# Patient Record
Sex: Female | Born: 1975 | Race: White | Hispanic: No | Marital: Married | Smoking: Never smoker
Health system: Southern US, Community
[De-identification: ages and names within clinical notes are randomized; demographics above are authoritative.]

## PROBLEM LIST (undated history)

## (undated) DIAGNOSIS — E079 Disorder of thyroid, unspecified: Secondary | ICD-10-CM

## (undated) DIAGNOSIS — E119 Type 2 diabetes mellitus without complications: Secondary | ICD-10-CM

## (undated) HISTORY — PX: EYE SURGERY: SHX253

---

## 1999-01-20 ENCOUNTER — Encounter: Admission: RE | Admit: 1999-01-20 | Discharge: 1999-04-20 | Payer: Self-pay | Admitting: Internal Medicine

## 2001-12-27 ENCOUNTER — Other Ambulatory Visit: Admission: RE | Admit: 2001-12-27 | Discharge: 2001-12-27 | Payer: Self-pay | Admitting: Obstetrics and Gynecology

## 2002-04-03 ENCOUNTER — Ambulatory Visit (HOSPITAL_COMMUNITY): Admission: RE | Admit: 2002-04-03 | Discharge: 2002-04-03 | Payer: Self-pay | Admitting: Obstetrics and Gynecology

## 2002-04-03 ENCOUNTER — Encounter: Payer: Self-pay | Admitting: Obstetrics and Gynecology

## 2002-04-26 ENCOUNTER — Inpatient Hospital Stay (HOSPITAL_COMMUNITY): Admission: AD | Admit: 2002-04-26 | Discharge: 2002-04-26 | Payer: Self-pay | Admitting: Obstetrics & Gynecology

## 2002-05-05 ENCOUNTER — Inpatient Hospital Stay (HOSPITAL_COMMUNITY): Admission: AD | Admit: 2002-05-05 | Discharge: 2002-05-05 | Payer: Self-pay | Admitting: Obstetrics and Gynecology

## 2002-05-09 ENCOUNTER — Inpatient Hospital Stay (HOSPITAL_COMMUNITY): Admission: AD | Admit: 2002-05-09 | Discharge: 2002-05-09 | Payer: Self-pay | Admitting: Obstetrics & Gynecology

## 2002-05-12 ENCOUNTER — Inpatient Hospital Stay (HOSPITAL_COMMUNITY): Admission: AD | Admit: 2002-05-12 | Discharge: 2002-05-19 | Payer: Self-pay | Admitting: Obstetrics and Gynecology

## 2002-05-12 ENCOUNTER — Encounter: Payer: Self-pay | Admitting: Obstetrics and Gynecology

## 2002-05-15 ENCOUNTER — Encounter: Payer: Self-pay | Admitting: Internal Medicine

## 2002-05-15 ENCOUNTER — Encounter: Payer: Self-pay | Admitting: Obstetrics & Gynecology

## 2002-05-15 ENCOUNTER — Encounter (INDEPENDENT_AMBULATORY_CARE_PROVIDER_SITE_OTHER): Payer: Self-pay

## 2002-05-16 ENCOUNTER — Encounter: Payer: Self-pay | Admitting: Cardiology

## 2002-06-15 ENCOUNTER — Other Ambulatory Visit: Admission: RE | Admit: 2002-06-15 | Discharge: 2002-06-15 | Payer: Self-pay | Admitting: Obstetrics and Gynecology

## 2002-11-07 ENCOUNTER — Ambulatory Visit (HOSPITAL_COMMUNITY): Admission: RE | Admit: 2002-11-07 | Discharge: 2002-11-07 | Payer: Self-pay | Admitting: Internal Medicine

## 2003-05-29 ENCOUNTER — Other Ambulatory Visit: Admission: RE | Admit: 2003-05-29 | Discharge: 2003-05-29 | Payer: Self-pay | Admitting: Obstetrics and Gynecology

## 2003-08-13 ENCOUNTER — Ambulatory Visit (HOSPITAL_COMMUNITY): Admission: RE | Admit: 2003-08-13 | Discharge: 2003-08-13 | Payer: Self-pay | Admitting: Obstetrics and Gynecology

## 2003-10-15 ENCOUNTER — Inpatient Hospital Stay (HOSPITAL_COMMUNITY): Admission: AD | Admit: 2003-10-15 | Discharge: 2003-10-15 | Payer: Self-pay | Admitting: Obstetrics & Gynecology

## 2003-10-29 ENCOUNTER — Inpatient Hospital Stay (HOSPITAL_COMMUNITY): Admission: AD | Admit: 2003-10-29 | Discharge: 2003-10-29 | Payer: Self-pay | Admitting: Obstetrics & Gynecology

## 2003-11-10 ENCOUNTER — Inpatient Hospital Stay (HOSPITAL_COMMUNITY): Admission: AD | Admit: 2003-11-10 | Discharge: 2003-11-10 | Payer: Self-pay | Admitting: Obstetrics & Gynecology

## 2003-11-20 ENCOUNTER — Encounter (INDEPENDENT_AMBULATORY_CARE_PROVIDER_SITE_OTHER): Payer: Self-pay | Admitting: Specialist

## 2003-11-20 ENCOUNTER — Inpatient Hospital Stay (HOSPITAL_COMMUNITY): Admission: AD | Admit: 2003-11-20 | Discharge: 2003-11-24 | Payer: Self-pay | Admitting: Obstetrics and Gynecology

## 2003-12-18 ENCOUNTER — Other Ambulatory Visit: Admission: RE | Admit: 2003-12-18 | Discharge: 2003-12-18 | Payer: Self-pay | Admitting: Obstetrics and Gynecology

## 2003-12-19 ENCOUNTER — Inpatient Hospital Stay (HOSPITAL_COMMUNITY): Admission: AD | Admit: 2003-12-19 | Discharge: 2003-12-19 | Payer: Self-pay | Admitting: Obstetrics and Gynecology

## 2004-09-15 ENCOUNTER — Encounter: Admission: RE | Admit: 2004-09-15 | Discharge: 2004-09-15 | Payer: Self-pay | Admitting: Obstetrics and Gynecology

## 2005-03-09 ENCOUNTER — Ambulatory Visit: Payer: Self-pay | Admitting: Hematology & Oncology

## 2005-06-23 ENCOUNTER — Encounter: Admission: RE | Admit: 2005-06-23 | Discharge: 2005-06-23 | Payer: Self-pay | Admitting: Obstetrics and Gynecology

## 2005-10-21 ENCOUNTER — Emergency Department (HOSPITAL_COMMUNITY): Admission: EM | Admit: 2005-10-21 | Discharge: 2005-10-21 | Payer: Self-pay | Admitting: Emergency Medicine

## 2006-01-04 ENCOUNTER — Ambulatory Visit (HOSPITAL_COMMUNITY): Admission: RE | Admit: 2006-01-04 | Discharge: 2006-01-04 | Payer: Self-pay | Admitting: Neurology

## 2006-01-04 ENCOUNTER — Encounter (INDEPENDENT_AMBULATORY_CARE_PROVIDER_SITE_OTHER): Payer: Self-pay | Admitting: Neurology

## 2008-12-06 ENCOUNTER — Emergency Department (HOSPITAL_BASED_OUTPATIENT_CLINIC_OR_DEPARTMENT_OTHER): Admission: EM | Admit: 2008-12-06 | Discharge: 2008-12-07 | Payer: Self-pay | Admitting: Emergency Medicine

## 2008-12-07 ENCOUNTER — Ambulatory Visit: Payer: Self-pay | Admitting: Diagnostic Radiology

## 2010-07-05 ENCOUNTER — Emergency Department (HOSPITAL_BASED_OUTPATIENT_CLINIC_OR_DEPARTMENT_OTHER)
Admission: EM | Admit: 2010-07-05 | Discharge: 2010-07-05 | Payer: Self-pay | Source: Home / Self Care | Admitting: Emergency Medicine

## 2010-10-06 LAB — URINALYSIS, ROUTINE W REFLEX MICROSCOPIC
Glucose, UA: 500 mg/dL — AB
Ketones, ur: >80 mg/dL — AB
Protein, ur: NEGATIVE mg/dL
Urobilinogen, UA: 1 mg/dL (ref 0.0–1.0)

## 2010-10-06 LAB — BASIC METABOLIC PANEL
BUN: 15 mg/dL (ref 6–23)
CO2: 18 mEq/L — ABNORMAL LOW (ref 19–32)
CO2: 19 mEq/L (ref 19–32)
Calcium: 9.9 mg/dL (ref 8.4–10.5)
Chloride: 101 mEq/L (ref 96–112)
Chloride: 108 mEq/L (ref 96–112)
Creatinine, Ser: 0.6 mg/dL (ref 0.4–1.2)
GFR calc Af Amer: >60 mL/min (ref >60)
GFR calc Af Amer: >60 mL/min (ref >60)
GFR calc non Af Amer: >60 mL/min (ref >60)
Glucose, Bld: 202 mg/dL — ABNORMAL HIGH (ref 70–99)
Potassium: 4.2 mEq/L (ref 3.5–5.1)
Potassium: 4.3 mEq/L (ref 3.5–5.1)
Sodium: 138 mEq/L (ref 135–145)

## 2010-10-06 LAB — CBC
HCT: 41.4 % (ref 36.0–46.0)
Hemoglobin: 14.8 g/dL (ref 12.0–15.0)
MCH: 29.4 pg (ref 26.0–34.0)
MCHC: 35.7 g/dL (ref 30.0–36.0)
MCV: 82.3 fL (ref 78.0–100.0)
Platelets: 288 10*3/uL (ref 150–400)
RBC: 5.03 MIL/uL (ref 3.87–5.11)
RDW: 11.8 % (ref 11.5–15.5)
WBC: 10.3 10*3/uL (ref 4.0–10.5)

## 2010-10-06 LAB — DIFFERENTIAL
Basophils Absolute: 0 10*3/uL (ref 0.0–0.1)
Basophils Relative: 0 % (ref 0–1)
Eosinophils Absolute: 0 10*3/uL (ref 0.0–0.7)
Eosinophils Relative: 0 % (ref 0–5)
Lymphocytes Relative: 16 % (ref 12–46)
Lymphs Abs: 1.7 10*3/uL (ref 0.7–4.0)
Monocytes Absolute: 0.5 10*3/uL (ref 0.1–1.0)
Monocytes Relative: 4 % (ref 3–12)
Neutro Abs: 8.2 10*3/uL — ABNORMAL HIGH (ref 1.7–7.7)
Neutrophils Relative %: 79 % — ABNORMAL HIGH (ref 43–77)

## 2010-10-06 LAB — GLUCOSE, CAPILLARY: Glucose-Capillary: 198 mg/dL — ABNORMAL HIGH (ref 70–99)

## 2010-10-06 LAB — PROTIME-INR
INR: 1.01 (ref 0.00–1.49)
Prothrombin Time: 13.5 seconds (ref 11.6–15.2)

## 2010-10-06 LAB — URINE MICROSCOPIC-ADD ON

## 2010-12-11 NOTE — Consult Note (Signed)
[name], [name]                    ACCOUNT NO.:  [id number]   MEDICAL RECORD NO.:  [id number]          PATIENT TYPE:  EMS   LOCATION:  MAJO                         FACILITY:  [facility]   PHYSICIAN:  [name], M.D.DATE OF BIRTH:  Jul 10, 1976   DATE OF CONSULTATION:  07/25/2006  DATE OF DISCHARGE:                                   CONSULTATION   REQUESTING PHYSICIAN:  [name] emergency room.   REASON FOR EVALUATION:  Right-sided numbness.   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  The patient reports that she was  at home today getting ready to go shopping when she noticed the sudden onset  of a numb sensation involving the right side of her face and her right arm,  especially the third, fourth and fifth digits.  This lasted about 20 minutes  and then mostly resolved, although it has remained present to a smaller and  slightly fluctuating extent.  She noticed a little bit of hesitancy in her  speech but no difficulty with dysarthria and no difficulty with chewing or  swallowing.  She denies any difficulty with walking or any difficulty using  the hand for any purpose.  She denies any associated chest pain, shortness  of breath, palpitations, or nausea or alteration of consciousness.  She does  have a left-sided headache which she describes as not throbbing and not  severe.  She says she has never had any symptoms like this before.  She came  to the emergency room for evaluation and neurologic consultation was  subsequently requested.   PAST MEDICAL HISTORY:  She has a history of juvenile-onset insulin-dependent  diabetes since age 35, presently has an insulin pump.  She admits that her  sugars are not under good control at this time.  She also reports  that she  has been diagnosed with a coagulation problem involving factor II.  This  came about because her daughter had a stroke at age 33 and was found to have  the disorder, and then she was subsequently tested and found to have this.  She states that she saw Dr. Myna Mann as well as a hematologist at [facility],  and both of them said that this did not require treatment but rather  recommended to start taking aspirin around age 35.   FAMILY HISTORY:  Remarkable for coagulopathy as above.  There is no history  of early-onset vascular disease.   SOCIAL HISTORY:  She does not smoke.  She has worked as a Runner, broadcasting/film/video in the  past.   MEDICATIONS:  Insulin pump, Synthroid, Zoloft.  She also took antibiotics  recently.   REVIEW OF SYSTEMS:  She had an ear infection last week and took antibiotics  for that.  Beyond that, a 14-system review of systems is negative except as  outlined in the HPI and in the emergency room records.  PHYSICAL EXAMINATION:  VITAL SIGNS:  Temperature 98.7, blood pressure  113/79, pulse 100, respirations 20.  GENERAL:  This is a healthy-appearing female seen in no distress.  HEENT:  Cranium is normocephalic, atraumatic.  Oropharynx is benign.  NECK:  Supple without carotid or supraclavicular bruits.  CARDIAC:  Regular rate and rhythm without murmurs.  NEUROLOGIC:  Mental status:  She is awake and alert and fully oriented to  time, place and person.  Recent and remote memory are intact.  Attention  span, concentration and fund of knowledge are all appropriate.  Speech is  fluent and not dysarthric.  Mood is euthymic and affect appropriate.  Cranial nerves:  Funduscopic exam reveals mild proliferative changes.  Pupils are equal and briskly reactive.  Extraocular movements full without  nystagmus.  Visual fields full to confrontation.  Hearing is intact to  confrontational speech.  Facial sensation intact to pinprick.  Face, tongue  and palate move normally and  symmetrically.  Shoulder shrug strength is  normal.  Motor testing:  Normal bulk and tone.  Normal strength in all  tested extremity muscles.  Sensation intact to light touch and pinprick in  all extremities.  Coordination:  Rapid movements are performed well, finger-  to-nose and heel-to-shin performed well.  Gait normal.  Reflexes 2+ and  symmetric, toes are downgoing bilaterally.   LABORATORY REVIEW:  MRI of the brain with MRA of the intracranial  circulation performed this evening at [facility] is normal.  Her  labs are unremarkable except for an elevated glucose of 199.   IMPRESSION:  Transient right face and arm numbness.  Given her history of  diabetes and this coagulation problem, this likely represents a small-vessel  transient ischemic attack.   RECOMMENDATIONS:  Will start aspirin daily.  She needs to follow up closely  with her PCP and also with her hematologist.  She is to follow up in our  office in about four weeks.  I think she should start aspirin.  She does not  require admission for further testing at this time, although I may choose to  do further blood testing and possibly other Doppler studies in the future  depending on how she does and how her risk factor control is in the near  future.   Thank you for the consultation.      [name], M.D.  Electronically Signed     MLR/MEDQ  D:  10/21/2005  T:  10/23/2005  Job:  [id number]   cc:   Marissa Pounds, MD  Fax: [phone]   [name], M.D.  Fax: [phone]

## 2010-12-11 NOTE — Op Note (Signed)
[name], [name]                              ACCOUNT NO.:  [id number]   MEDICAL RECORD NO.:  [id number]                   PATIENT TYPE:  INP   LOCATION:  9302                                 [facility]ACILITY:  [facility]   PHYSICIAN:  Maxie Better, M.D.            DATE OF BIRTH:  1976-03-28   DATE OF PROCEDURE:  11/20/2003  DATE OF DISCHARGE:                                 OPERATIVE REPORT   PREOPERATIVE DIAGNOSES:  1. Preterm labor.  2. Intrauterine gestation at 33-1/7 weeks.  3. Previous cesarean section.  4. Desires sterilization.  5. Class C diabetic.   PROCEDURES:  1. Repeat low transverse cesarean section.  2. Modified Pomeroy tubal ligation.   POSTOPERATIVE DIAGNOSES:  1. Preterm labor.  2. Previous cesarean section.  3. Intrauterine gestation at 33-1/7 weeks.  4. Class C diabetes.  5. Desires sterilization.  6. Fetal macrosomia.   ANESTHESIA:  Spinal.   SURGEON:  Maxie Better, M.D.   ASSISTANT:  Richardean Sale, M.D.   INDICATIONS:  This is a 35 year old gravida 2, para 0-1-0-1, married white  female at 33-1/7 weeks' gestation, with known class C diabetes on insulin  pump, who presented in preterm labor.  The patient's history is notable for  a preterm birth at 31+ weeks via a low transverse cesarean section.  The  patient does desire a repeat cesarean section as well as a sterilization.  The patient's prenatal course has been complicated by premature contractions  with the cervix remaining closed until the exam noted in the office today,  November 20, 2003, when the patient presented with very painful contractions  and passage of a mucus plug.  She has been followed with fetal fibronectin  studies, cervical length by ultrasound, and has been on alpha-hydroxy  progesterone on a weekly basis in order to decrease her chance of a repeat  preterm delivery.  The patient's fetal fibronectin, however, was positive  today.  Her history is most significant for pulmonary  edema secondary to  magnesium sulfate tocolytic and given that prior history, a decision was  made not to tocolyze this patient, who is now 1 cm, 60-70%, -2 to -3, vertex  presentation.  The patient and her husband conferred with the plan, the NICU  was informed.  The risks of the procedure had been explained, consent was  signed, the patient was transferred to the operating room.   PROCEDURE:  Under adequate spinal anesthesia, the patient was placed in a  supine position with a left lateral tilt.  She was sterilely prepped and  draped in the usual fashion.  Indwelling Foley catheter was placed.  The  incision was injected with 0.25% Marcaine.  Pfannenstiel skin incision was  then made through the previous scar and carried down to the rectus fascia.  The rectus fascia was incised in the midline, extended bilaterally.  The  rectus fascia was dissected off the rectus  muscle inferiorly and in doing  so, the parietal peritoneum was entered.  The scarring from the rectus  fascia to the rectus muscle superiorly was also noted to be dense and  careful dissection was then made with entry into the parietal peritoneum  anteriorly as well.  Using that window in the parietal peritoneum, the  incision was extended in order to facilitate the delivery.  There was a  small amount of bladder adhesions noted.  The vesicouterine peritoneum was  then developed.  The bladder was then sharply dissected off the lower  uterine segment.  A curvilinear low transverse incision was then made and  extended bilaterally.  Artificial rupture of membranes was performed.  Copious amount of clear amniotic fluid was noted.  There was a gush of blood  noted on the left side, unclear as to whether or not this is evidence of an  abruption.  Subsequent delivery of a live female from the right occiput  transverse position was accomplished.  The cord was around the body.  The  baby was bulb-suctioned on the abdomen.  The cord was  clamped, cut.  The  baby was transferred to the awaiting pediatricians, who assigned Apgars of 8  and 9 at one and five minutes.  Cord pH was obtained.  The placenta was  removed and sent to pathology as well.  The uterine cavity was palpated,  inspected, and no intracavitary lesions noted.  The uterine incision had a  small extension on the left.  The uterine incision was closed with 0  Monocryl running locked stitch.  On the left side there was some bleeding,  and figure-of-eight sutures x2 were then utilized to accomplish hemostasis.  Small bleeding on the edges of the lower uterine segment was then  cauterized.  Attention was then turned to the tubes and ovaries.  Both  ovaries were noted to be bilaterally normal, as well as the tubes.  The left  fallopian tube was identified.  The midportion was grasped with a Babcock.  The underlying mesosalpinx was opened.  Chromic 0 sutures were placed  proximally x2, as well as distally x2, and the intervening segment of  fallopian tube was removed and the stump cauterized.  This procedure was  performed on the contralateral side, removing the midportion of the right  fallopian tube.  The abdomen was irrigated, suctioned of debris.  Reinspection of the uterine incision showed good hemostasis.  A decision was  then made to close the patient.  The parietal peritoneum was now closed.  The rectus fascia was closed with 0 Vicryl x2 after the underlying area was  inspected and bleeders cauterized.  The subcutaneous area was irrigated,  suctioned of debris, small bleeders cauterized, and the skin approximated  with Ethicon staples.  Specimen was placenta and portion of the right and  left fallopian tube, all sent to pathology.  Estimated blood loss was 600  mL.  Intraoperative fluid was about 1700 mL crystalloid.  Urine output was  about 70 mL of clear urine.  The patient had voided prior to entering the operating room.  Sponge and instrument counts x2  were correct.  Complication  was none.  The patient tolerated the procedure well and was transferred to  the recovery room in stable condition.  Maxie Better, M.D.    /MEDQ  D:  11/20/2003  T:  11/21/2003  Job:  [id number]

## 2010-12-11 NOTE — Discharge Summary (Signed)
[name], [name]                              ACCOUNT NO.:  [id number]   MEDICAL RECORD NO.:  [id number]                   PATIENT TYPE:  INP   LOCATION:  9146                                 FACILITY:  [facility]   PHYSICIAN:  Genia Del, M.D.             DATE OF BIRTH:  [birth year]   DATE OF ADMISSION:  05/12/2002  DATE OF DISCHARGE:  05/19/2002                                 DISCHARGE SUMMARY   ADMISSION DIAGNOSES:  1. Gestation at 30 weeks and six days with preterm labor.  2. Class C diabetes mellitus.   DISCHARGE DIAGNOSES:  1. Gestation at 30 weeks and six days with preterm labor.  2. Class C diabetes mellitus.  3. Polyhydramnios.  4. Pulmonary edema.  5. Nonreassuring fetal heart monitoring.  6. Baby boy born by urgent cesarean section on May 15, 2002.   HOSPITAL COURSE:  The patient is a 35 year old G1 with class C diabetes  mellitus who presented with preterm labor on May 12, 2002.  She was  given magnesium sulfate to control preterm labor and developed pulmonary  edema.  Magnesium sulfate had been stopped on October 20 and she developed  worsening pulmonary edema for which she was evaluated around 5:50 a.m. on  May 15, 2002.  At that time she was on Procardia and Motrin.  A severe  case of polyhydramnios was also worsening.  A decompression amniocentesis  was therefore performed, removing about 450 cc of amniotic fluid.  The fluid  was clear; it was sent for L/S and PG and a culture was done on it.  The  chest x-ray confirmed probably pulmonary edema.  Her O2 saturations were 85-  88% with O2 mask rebreather at 15%.  The patient was contracting every two  to three minutes lasting 60 seconds and those were painful.  Her cervix was  1+, 80%, vertex, 0.  Given the fetal heart rate with a baseline at 200 with  repetitive decelerations, and with the continued uterine contractions for  which magnesium sulfate could not be restarted because of the pulmonary  edema, the decision was taken to proceed with urgent primary C section.  The  patient had a baby boy at 13.  Apgars were 6 and 9.  The neonatal team was  present.  The enterotomy was closed in two planes.  The estimated blood loss  was 800 cc.  The patient received Flagyl during the surgery and no  complications occurred.  The postoperative evolution was unremarkable.  The  pulmonary edema resolved with an excellent diuresis and the hemoglobin  postoperatively on October 22 was 10.5 with a hematocrit at 30.7.   DISPOSITION:  The patient was discharged on postoperative day #4 in a stable  status.  Postoperative postpartum advices were given, and the patient will  follow up at [facility] office within four weeks.  She will follow up  with  Dr. Timothy Lasso for diabetes mellitus.                                               Genia Del, M.D.    ML/MEDQ  D:  07/16/2002  T:  07/17/2002  Job:  [id number]

## 2010-12-11 NOTE — Consult Note (Signed)
[name], [name]                              ACCOUNT NO.:  [id number]   MEDICAL RECORD NO.:  [id number]                   PATIENT TYPE:  MAT   LOCATION:  MATC                                 [facility]ACILITY:  [facility]   PHYSICIAN:  [name], M.D.             DATE OF BIRTH:  01/07/1976   DATE OF CONSULTATION:  DATE OF DISCHARGE:  05/05/2002                                   CONSULTATION   CHIEF COMPLAINT:  Contractions.   HISTORY OF PRESENT ILLNESS:  The patient is a 35 year old white female G1,  P0 at 89 and 6/7 weeks who presents with increased frequency of  contractions.  She was given Motrin to take q.6h. in the last 24 hours with  marked improvement in her contractions one day ago, now with increased  frequency this evening after taking ibuprofen.  She presented to [facility] with increased frequency of contractions and a reactive fetal heart  rate tracing.  Given Procardia 10 mg on one occasion with good spacing out  of her contractions.  Noted after approximately an hour contraction  frequency increased.  She is given now terbutaline subcutaneous x1 with good  response.   PAST OBSTETRIC HISTORY:  Noncontributory.   ALLERGIES:  She has no known drug allergies.   MEDICATIONS:  Prenatal vitamins and insulin pump.   PAST MEDICAL HISTORY:  She has a history of insulin-dependent diabetes since  age 34.  She has no history of any medical hospitalizations outside of  diabetes.  History of wisdom tooth removal in 1997.   FAMILY HISTORY:  There is a family history of thyroid dysfunction,  cardiovascular disease, emphysema, and diabetes.   PRENATAL LABORATORY DATA:  Blood type A-.  RhoGAM given.  Rh antibody  negative.  Rubella immune.  HIV declined.  Prenatal course otherwise  uncomplicated, reportedly normal fetal echocardiogram at 20 weeks.  Normal  targeted ultrasound as well.   PHYSICAL EXAMINATION:  GENERAL:  She is an anxious appearing white female in  no apparent  distress.  HEENT:  Normal.  LUNGS:  Clear.  HEART:  Regular rate and rhythm.  ABDOMEN:  Soft, gravid, and nontender.  PELVIC:  Cervix is closed, flared, firm, 3 cm long, presenting part is out  of the pelvis.  EXTREMITIES:  No cords.  NEUROLOGIC:  Nonfocal.  SKIN:  Intact.   LABORATORIES:  NST is reactive.  Fetal heart rate in the 140-160 beat per  minute range with accelerations.  No evidence of decelerations.  No  contractions noted over the last 30 minutes.   IMPRESSION:  1. A 29 and 6/7 week intrauterine pregnancy.  2. Preterm contractions.  No evidence of cervical change.  Negative fetal     fibronectin three days ago.  3. Insulin-dependent diabetes with borderline compliance.  Reported history     of large for gestational age with polyhydramnios.   PLAN:  Follow up in the office in three days.  Discharged home on Procardia  10 mg q.4-6h.  Preterm labor warnings given.  Glucose compliance discussed.                                               [name], M.D.    RJT/MEDQ  D:  05/05/2002  T:  05/07/2002  Job:  [id number]   cc:   [facility]

## 2010-12-11 NOTE — Op Note (Signed)
NAME:  MARIAN, MENEELY                              ACCOUNT NO.:  [id number]   MEDICAL RECORD NO.:  [id number]                   PATIENT TYPE:  INP   LOCATION:  9179                                 [facility]ACILITY[facility]   PHYSICIAN:  Genia Del, M.D.             DATE OF BIRTH:  1976/02/15   DATE OF PROCEDURE:  05/15/2002  DATE OF DISCHARGE:                                 OPERATIVE REPORT   PREOPERATIVE DIAGNOSES:  1. A 31 week and 3 day gestation.  2. Insulin-dependent diabetes mellitus.  3. Preterm labor.  4. Polyhydramnios.  5. Nonreassuring fetal heart rate monitoring.  6. Pulmonary edema with O2 saturations below 90.   POSTOPERATIVE DIAGNOSES:  1. A 31 week and 3 day gestation.  2. Insulin-dependent diabetes mellitus.  3. Preterm labor.  4. Polhydramnios.  5. Nonreassuring fetal heart rate monitoring.  6. Pulmonary edema with O2 saturations below 90.   INTERVENTION:  Urgent primary low transverse cesarean section.   ANESTHESIOLOGIST:  Quillian Quince, M.D.   SURGEON:  Genia Del, M.D.   ASSISTANT:  Lenoard Aden, M.D.   PROCEDURE:  Under spinal anesthesia the patient is in 15 degree left  decubitus.  She is prepped with Hibiclens on the abdominal, suprapubic, and  vulvar area.  The bladder catheter is already in place and the patient is  draped as usual.  A Pfannenstiel incision is made with a scalpel.  The  aponeurosis is opened transversely with the Mayo scissors.  We separate the  recti muscles from the aponeurosis on the midline superiorly and inferiorly.  The parietoperitoneum is opened bluntly.  We then put the bladder retractor  in place.  The visceroperitoneum is opened transversely over the lower  uterine segment with the Metzenbaum scissors.  The bladder is reclined  downward and the bladder retractor is repositioned.  We open the lower  uterine segment transversely with the scalpel and prolong on each side with  the scissors.  The amniotic fluid  is clear and abundant.  Presentation is  cephalic.  A loose nuchal cord is present and goes down to the shoulder at  the time of delivery.  The baby is suctioned after delivery of the head.  We  clamp the cord and cut it.  The baby is given to the neonatal team.  Apgars  are 6 and 9.  Cord pH is done and the blood cord is taken.  The placenta is  delivered spontaneously.  It is complete with three vessels.  It is sent to  pathology.  The uterus is revised.  It contracts well with Pitocin in the IV  fluids.  A dose of Flagyl 500 mg IV is given.  The hysterotomy is closed in  a full locked running suture with Vicryl 0.  A second layer is done with  Vicryl 0 in a mattress fashion.  Two X stitches are  added in the midline and  on the left lateral side of the hysterotomy to complete hemostasis with a  Vicryl 0.  Hemostasis is adequate on the hysterotomy.  We then irrigate and  suction the abdominopelvic cavity.  We complete hemostasis with  electrocautery at the level of the bladder flap and on the recti muscles and  aponeurosis.  We close the aponeurosis with two half running sutures of  Vicryl 0.  The count of instruments and sponges is complete x2.  We complete  hemostasis on the adipose tissue with the electrocautery.  Infiltration of  Marcaine 0.25 plain a total of 10 cc is done subcutaneously.  We then  approximate the skin with staples.  A dry dressing is applied.  The  estimated blood loss is 800 cc.  No complications occurred and the patient  was brought to recovery room in good status.  Note a dose of Lasix 20 mg IV  was given in the operating room at the end of the case.  O2 saturations  remained above 90% during the cesarean section.                                               Genia Del, M.D.    ML/MEDQ  D:  05/15/2002  T:  05/15/2002  Job:  [id number]

## 2010-12-11 NOTE — Discharge Summary (Signed)
[name], [name]                              ACCOUNT NO.:  [id number]   MEDICAL RECORD NO.:  [id number]                   PATIENT TYPE:  INP   LOCATION:  9302                                 FACILITY:  [facility]   PHYSICIAN:  Maxie Better, M.D.            DATE OF BIRTH:  1976/06/18   DATE OF ADMISSION:  11/20/2003  DATE OF DISCHARGE:  11/24/2003                                 DISCHARGE SUMMARY   ADMISSION DIAGNOSES:  1. Preterm labor.  2. Intrauterine gestation at 66 and 1/7ths weeks.  3. Class C diabetes.  4. Rh negative.  5. Previous cesarean section.  6. Desires permanent sterilization.  7. Hypothyroidism.   PROCEDURE:  1. Repeat cesarean section.  2. Modified Pomeroy tubal ligation.   POSTOPERATIVE DIAGNOSES:  1. Intrauterine gestation at 54 and 1/7ths weeks, delivered.  2. Preterm labor.  3. Previous cesarean section.  4. Class C diabetes.  5. Hypothyroidism.  6. Desires sterilization.  7. Polyhydramnios.   ANESTHESIA:  Spinal.   SURGEON:  Maxie Better, M.D.   ASSISTANT:  Richardean Sale, M.D.   HISTORY OF PRESENT ILLNESS:  This is a 35 year old, gravid 2, para 0-1-0-1,  married white female with class C diabetes, who is now at 60 and 1/7ths  weeks with a previous cesarean section, admitted in preterm labor.  The  patient's renal course has been complicated by polyhydramnios.  She is known  to have hypothyroidism.  Her class C diabetes has been managed by Dr. Timothy Lasso  using an insulin pump.  The patient is Rh negative, and received RhoGAM.  The patient has been getting weekly alpha hydroxyprogesterone due to her  prior history of preterm delivery at 31 weeks.  That pregnancy had been  complicated by pulmonary edema secondary to magnesium tocolysis.  The  patient presented with complaints of contractions every 2 minutes.  Her  cervix exam up until Tuesday, November 19, 2003, had been closed.  A fetal  fibronectin was done every 2 weeks.  The last fetal  fibronectin on November 20, 2003 was positive.  The patient's cervical exam on presentation was 1, 70%,  -2.  Vertex well applied to the cervix.  A group B strep culture had been  negative at 28 weeks.  The patient had not been given betamethasone due to  her class C diabetes.  Given her poor response to tocolytics in the past,  the decision was made to forego attempt at magnesium sulfate again.  The  patient and husband concurred with the plan.  She also desired a repeat  cesarean section, as well as permanent sterilization.  Consent was obtained  with the appropriate planned procedures.  The patient was transferred to the  operating room.   HOSPITAL COURSE:  The patient was taken to the operating room where she  underwent spinal anesthesia.  She underwent a repeat cesarean section, as  well as a  modified Pomeroy tubal ligation.  The procedure resulted in the  delivery of a live female, Apgars of 8/9, cord pH of 7.25.  The baby was  transferred to the NICU due to prematurity.  The weight of the baby was 6  pounds, 8 ounces.  The patient had normal tubes and ovaries bilaterally.  Her postoperative course was notable for ongoing management of her class C  diabetes by Dr. Timothy Lasso using a Glucomander.  She was continued on her  Synthroid medications.  She received RhoGAM due to the baby's blood type  being A positive.  The patient was subsequently converted back to an insulin  pump.  She was placed on a diabetes management diet.  A CBC on postoperative  day #1 showed a hemoglobin of 12.8, white count of 9.5, hematocrit 37.9,  platelet count 169,000.  The pathology revealed a third trimester placenta,  and complete transection of both fallopian tubes.  The patient remained  afebrile throughout her hospitalization.  By postoperative day #4, she was  tolerating her diet, passing flatus, no evidence of infection, and was  deemed ready to be discharged home.   DISPOSITION:  Home.   CONDITION:   Stable.   DISCHARGE MEDICATIONS:  1. Synthroid.  2. NovoLog insulin pump.  3. Prenatal vitamins.  4. Zantac p.r.n.  5. Motrin 800 mg one p.o. q.6h. p.r.n. pain.   FOLLOW UP APPOINTMENT:  1. At [facility] in 4-6 weeks.  2. Other follow up with Dr. Timothy Lasso regarding the diabetes and hypothyroidism.   DISCHARGE INSTRUCTIONS:  Per postpartum booklet given.                                               Maxie Better, M.D.    [place]/MEDQ  D:  12/07/2003  T:  12/07/2003  Job:  [id number]

## 2010-12-11 NOTE — Consult Note (Signed)
[name], [name]                              ACCOUNT NO.:  [id number]   MEDICAL RECORD NO.:  [id number]                   PATIENT TYPE:  INP   LOCATION:  9153                                 FACILITY:  [facility]   PHYSICIAN:  Gaspar Garbe, M.D.            DATE OF BIRTH:  10/09/1975   DATE OF CONSULTATION:  05/13/2002  DATE OF DISCHARGE:                                   CONSULTATION   REASON FOR CONSULTATION:  Preterm labor with diabetes mellitus type 1.   HISTORY OF PRESENT ILLNESS:  The patient is a 35 year old, white female who  is [redacted] weeks pregnant with her first child.  She has had difficulties with  blood sugar control in the past and has been diabetic since age 42.  She is  currently and insulin pump using Humalog through a MiniMed 508 pump.  She  has been followed per routine by Dr. Creola Corn with [facility] for her diabetes.  The patient states that she has had  historically poor control, but very good control imperatively during this  time of her pregnancy.   She came into [facility] after having cramps and was evaluated by Dr.  Cherly Hensen and seemed to be in preterm labor.  At that point, she was given  magnesium and betamethasone, a steroid which would affect and elevate her  blood sugars.  Dr. Cherly Hensen and I spoke on the phone after I received some  information regarding the patient's status and her insulin settings on her  pump in her usual bolus regimen and a sliding scale was written out for the  nursing staff to use through the pump in lieu of IV insulin that has been  initially suggested by Dr. Cherly Hensen.   The patient had reasonably good control overnight with a blood glucose at 1  a.m. at 131, 3 a.m. 147, but then gradually started to escalate throughout  the rest of the day to 247, 227, 158, 285 and most recently 221 after 10  units of bolus.  She is still having contractions consistent with preterm  labor and had some nausea with the  contractions, but this is separate from  her elevated sugar issue at this point.  Otherwise, she feels reasonably  well.  She indicates that she has had some difficulty with sites as her  pregnancy has gone on and has to change approximately every 1-1/2 days.  In  addition, she is due to have her pump set up and her reservoir refilled  today and the they have brought supplies from home to be able to do this.   PAST MEDICAL HISTORY:  Diabetes mellitus type 1 since age 27.   PAST SURGICAL HISTORY:  Wisdom teeth extraction.   ALLERGIES:  No known drug allergies.   MEDICATIONS:  Humalog per pump for a hydrate ratio of 1 to 12.  Her usual  formula is CBG - 100/10 and increase from a factor of 25 which was her  initial prepregnancy factor.   SOCIAL HISTORY:  The patient is married.  This is her first pregnancy.  She  is an [employer].  No smoking and no drinking.   FAMILY HISTORY:  She has some diabetes in a grandfather and grandmother, one  believed to be type 2 and one believed to be type 1.   REVIEW OF SYMPTOMS:  The patient is having some mild nausea and abdominal  cramping, consistent with labor, otherwise within normal limits.   PHYSICAL EXAMINATION:  GENERAL:  Her blood sugars are stated as above with  the most recent being 221 at 11 a.m.  She is in no acute distress, but  uncomfortable.  LUNGS:  Clear to auscultation bilaterally.  HEART:  Regular rate and rhythm without murmur.  ABDOMEN:  Pregnant with no focal changes or tenderness.  She has some  bruising at old sites, but otherwise look okay.  EXTREMITIES:  No pitting edema present.   LABORATORY DATA AND X-RAY FINDINGS:  The patient most recently had a  magnesium level of 5.  RPR negative on admission.  White count 8.9,  hemoglobin 11.9, platelets 214.  Normal electrolytes with a blood glucose of  147 at 2023 hours on the day of admission, May 12, 2002.  She has a  creatinine of 0.6.  Normal uric acid of  4.2.   ASSESSMENT/PLAN:  The patient is in preterm labor.  This is being managed by  Dr. Cherly Hensen.  The patient has been given steroids to try and help with this  which will, in fact, increase her blood sugars.  I was not convinced of her  factor of 10.  Over the phone, the patient and Dr. Cherly Hensen had initially  said that her factor was 2 which would give her approximately 50 units for  every 100 that she is above 100, which seemed to be terribly excessive given  her basals which are approximately 30 units a day.  A sliding scale was  initially written and this will be revised with her Humalog being three  units for 150-175 reading, six units for 176-200 reading, eight units for a  201-250 reading, 12 units for a 251-300 reading and 14 units to 301+.  The  patient is being checked every two hours and understandably will have some  overlap given the half life of Humalog being four hours.  This is slightly  less than her normal 1/10 ratio.  She will be checked every hour with blood  glucoses until she once again drops below 200 and then will resume q.2h.  checks while here in the hospital.  We will watch her over the next 24 hours  from 8 a.m. today to 8 a.m. on October 20, to try and determine  approximately how much extra sliding scale she is requiring and give her  approximately half of this back as the basal rate for short-term as she has  the betamethasone on board and that she is not using as much bolus.  Compared to what her basals are, her bolus is significantly higher  presently.  We will look for any signs of ketosis.  I do not believe her  nausea is related to this, however, and encourage her if she is eating to  continue with the 1/12 bolus factor.  In addition, will write orders  specifically allowing her to suspend her pump to change her  pump set and  refill her reservoir with Humalog as she is due for that today.                                               Gaspar Garbe, M.D.    RWT/MEDQ  D:  05/13/2002  T:  05/14/2002  Job:  [id number]   cc:   Maxie Better, MD  [address]  [address]  [place]  [place] [address]  Fax: [phone]   Gwen Pounds, M.D.

## 2011-11-30 IMAGING — CT CT ANGIO CHEST
2 of 6 series · 19 of 36 positions shown · IV contrast (APPLIED)
Comparison: None.

CLINICAL DATA: Chest pain.  Diabetes.

CT ANGIOGRAPHY CHEST WITH CONTRAST
TECHNIQUE: Multidetector CT imaging of the chest was performed
using the standard protocol during bolus administration of
intravenous contrast.  Multiplanar CT image reconstructions
including MIPs were obtained to evaluate the vascular anatomy.
Contrast:  100 ml Omnipaque three-phase the.

[Series 5: pe 1.0 b25f · axial · 0.59mm/px · z∈[-131,+105]mm · 18 of 264 slices shown]
[im 14/264  lung]
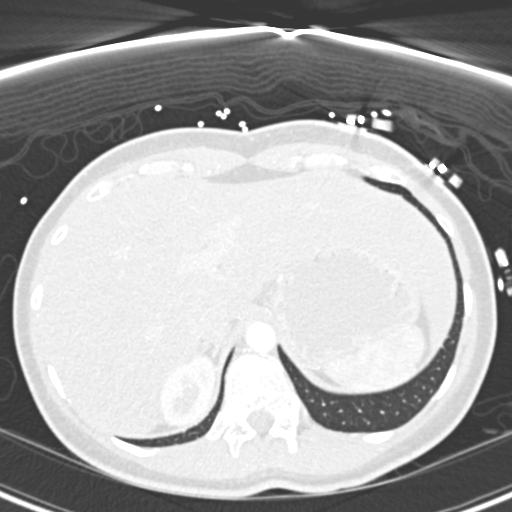
[im 27/264  mediastinal]
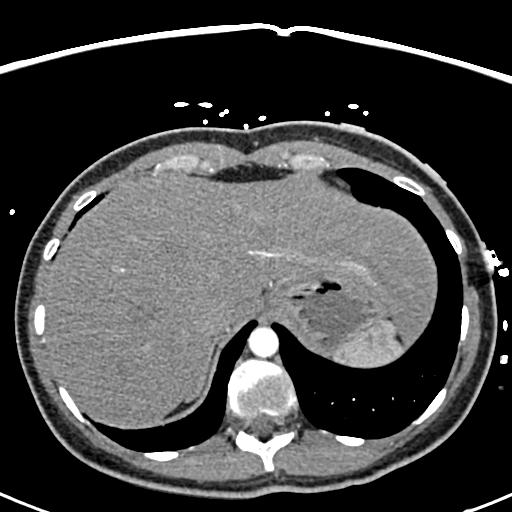
[im 40/264  lung]
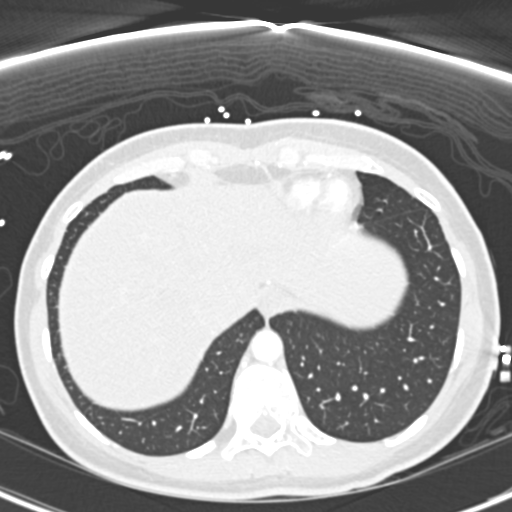
[im 53/264  mediastinal]
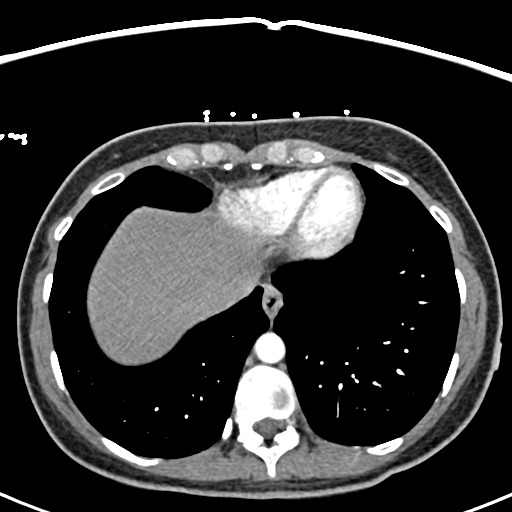
[im 66/264  lung]
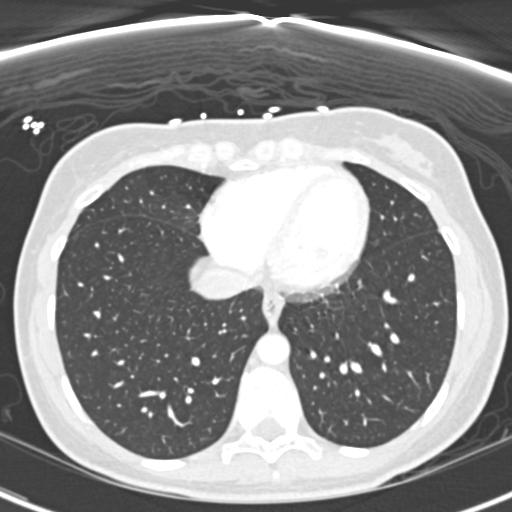
[im 79/264  mediastinal]
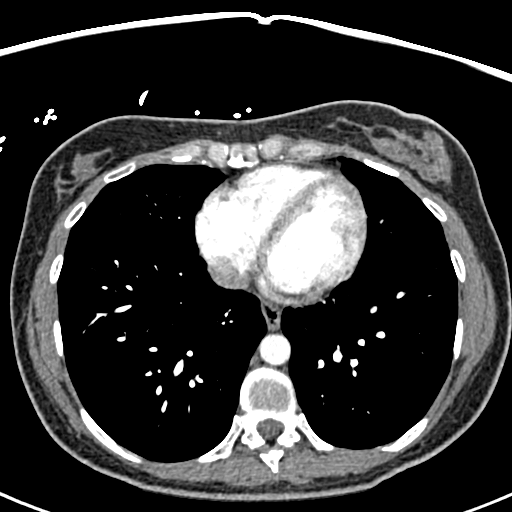
[im 93/264  lung]
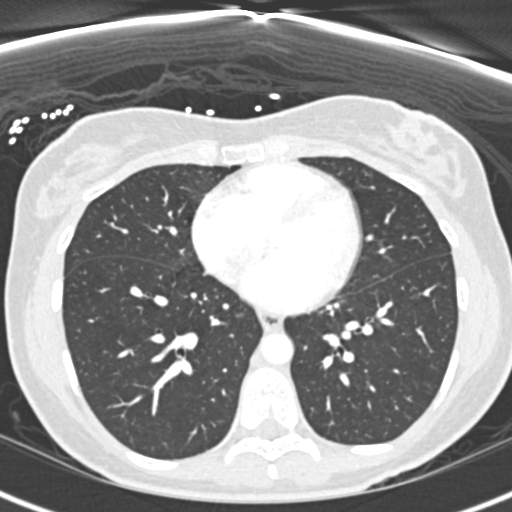
[im 106/264  mediastinal]
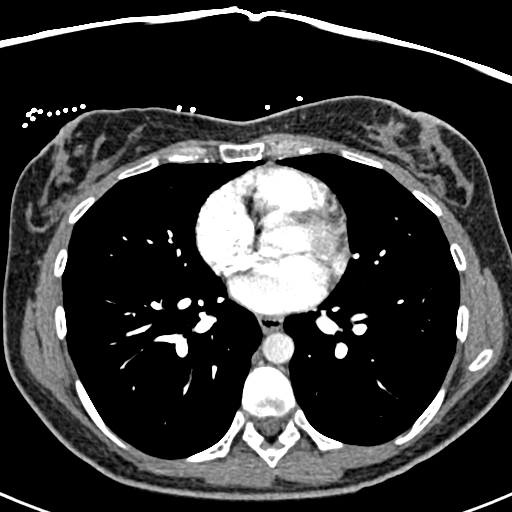
[im 119/264  lung]
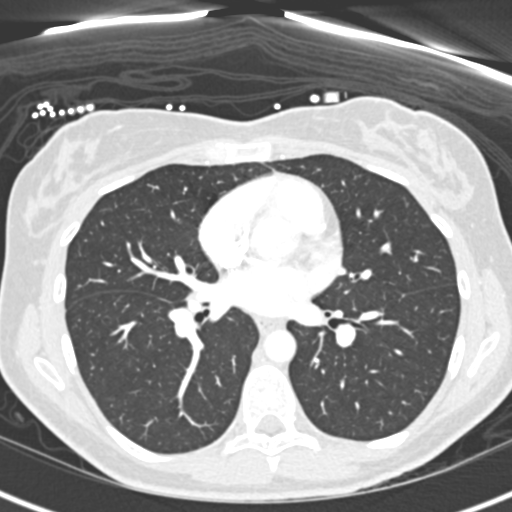
[im 145/264  mediastinal]
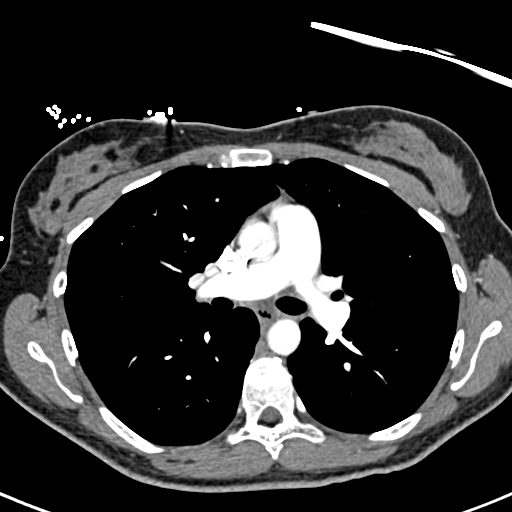
[im 158/264  lung]
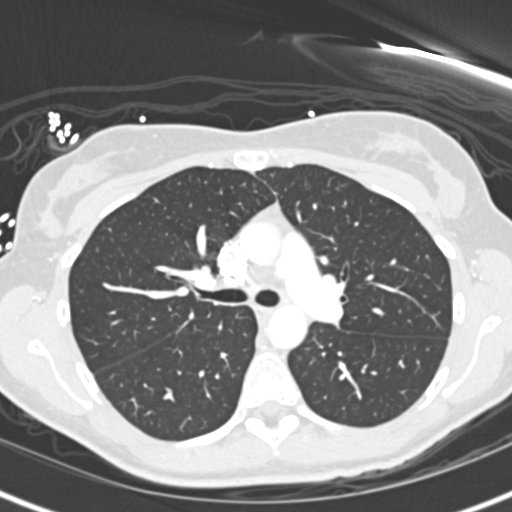
[im 171/264  mediastinal]
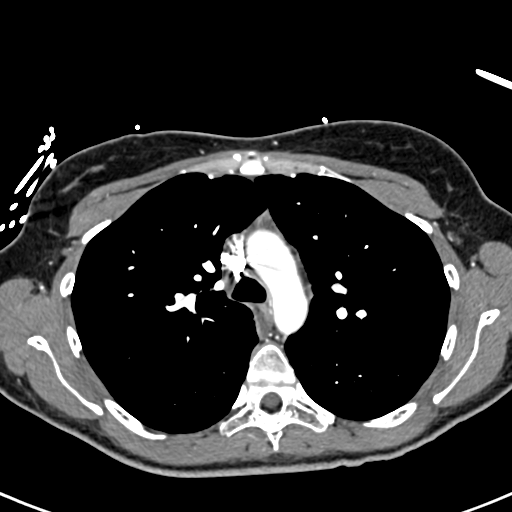
[im 185/264  lung]
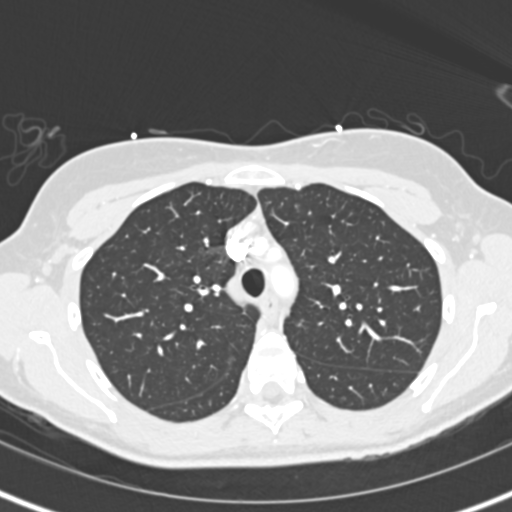
[im 198/264  mediastinal]
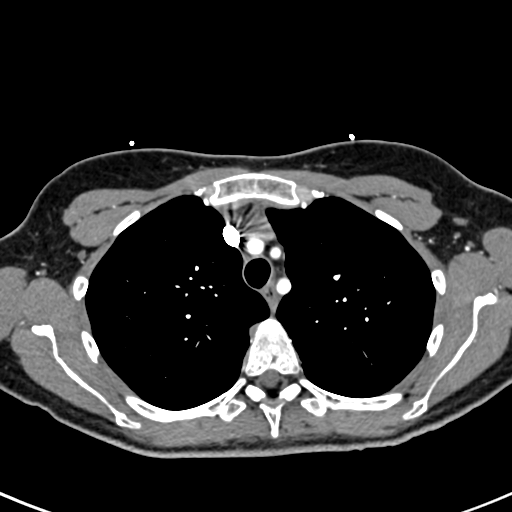
[im 211/264  lung]
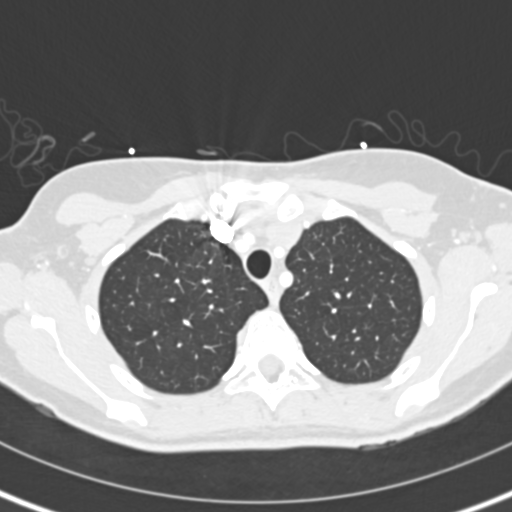
[im 224/264  mediastinal]
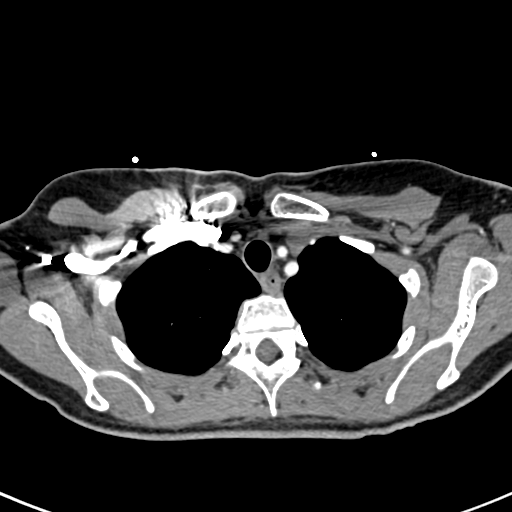
[im 237/264  lung]
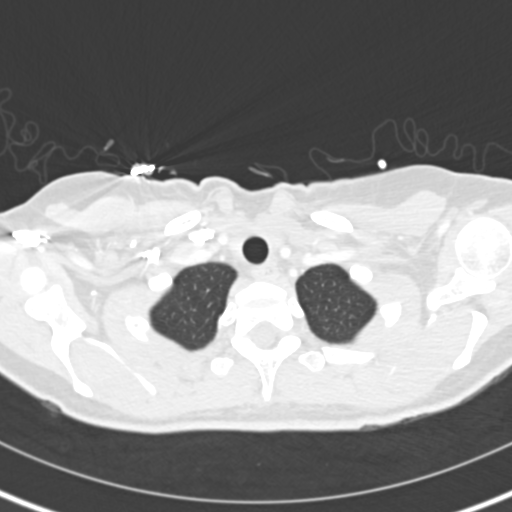
[im 250/264  mediastinal]
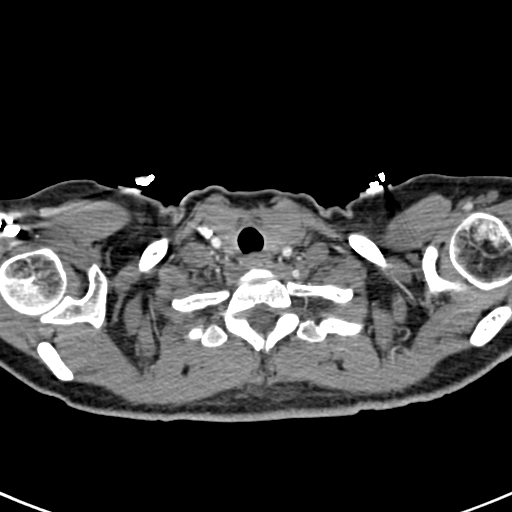

[Series 8: pe 2.0 coronal · coronal · 0.58mm/px · 1 of 105 slices shown]
[im 53/105  mediastinal]
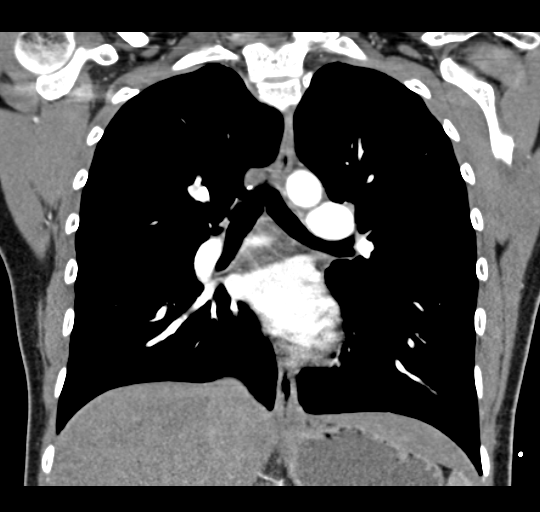

[19 of 36 positions shown; findings below may reference images not displayed]

FINDINGS: Technically adequate study without pulmonary embolus.
No acute aortic abnormality.  The heart and aorta appear normal.
No axillary, mediastinal, or hilar adenopathy.  No pericardial or
pleural effusion.  Incidental imaging the upper abdomen is normal.
The lungs are clear.  The bones are normal.

Review of the MIP images confirms the above findings.
IMPRESSION: Normal CTA chest.

## 2014-07-11 ENCOUNTER — Encounter (HOSPITAL_BASED_OUTPATIENT_CLINIC_OR_DEPARTMENT_OTHER): Payer: Self-pay | Admitting: *Deleted

## 2014-07-11 ENCOUNTER — Inpatient Hospital Stay (HOSPITAL_BASED_OUTPATIENT_CLINIC_OR_DEPARTMENT_OTHER)
Admission: EM | Admit: 2014-07-11 | Discharge: 2014-07-13 | DRG: 638 | Disposition: A | Discharge status: Home / Self Care | Payer: Managed Care, Other (non HMO) | Attending: Internal Medicine | Admitting: Internal Medicine

## 2014-07-11 ENCOUNTER — Emergency Department (HOSPITAL_BASED_OUTPATIENT_CLINIC_OR_DEPARTMENT_OTHER): Payer: Managed Care, Other (non HMO)

## 2014-07-11 DIAGNOSIS — E1110 Type 2 diabetes mellitus with ketoacidosis without coma: Secondary | ICD-10-CM | POA: Diagnosis present

## 2014-07-11 DIAGNOSIS — Z794 Long term (current) use of insulin: Secondary | ICD-10-CM | POA: Diagnosis not present

## 2014-07-11 DIAGNOSIS — D689 Coagulation defect, unspecified: Secondary | ICD-10-CM | POA: Diagnosis present

## 2014-07-11 DIAGNOSIS — Z880 Allergy status to penicillin: Secondary | ICD-10-CM

## 2014-07-11 DIAGNOSIS — D72829 Elevated white blood cell count, unspecified: Secondary | ICD-10-CM | POA: Diagnosis present

## 2014-07-11 DIAGNOSIS — M8580 Other specified disorders of bone density and structure, unspecified site: Secondary | ICD-10-CM | POA: Diagnosis present

## 2014-07-11 DIAGNOSIS — H3500 Unspecified background retinopathy: Secondary | ICD-10-CM | POA: Diagnosis present

## 2014-07-11 DIAGNOSIS — J329 Chronic sinusitis, unspecified: Secondary | ICD-10-CM | POA: Diagnosis present

## 2014-07-11 DIAGNOSIS — E039 Hypothyroidism, unspecified: Secondary | ICD-10-CM | POA: Diagnosis present

## 2014-07-11 DIAGNOSIS — E1310 Other specified diabetes mellitus with ketoacidosis without coma: Secondary | ICD-10-CM

## 2014-07-11 DIAGNOSIS — E1010 Type 1 diabetes mellitus with ketoacidosis without coma: Principal | ICD-10-CM | POA: Diagnosis present

## 2014-07-11 DIAGNOSIS — R1110 Vomiting, unspecified: Secondary | ICD-10-CM

## 2014-07-11 DIAGNOSIS — Z8673 Personal history of transient ischemic attack (TIA), and cerebral infarction without residual deficits: Secondary | ICD-10-CM | POA: Diagnosis not present

## 2014-07-11 DIAGNOSIS — R079 Chest pain, unspecified: Secondary | ICD-10-CM

## 2014-07-11 DIAGNOSIS — R112 Nausea with vomiting, unspecified: Secondary | ICD-10-CM | POA: Diagnosis not present

## 2014-07-11 HISTORY — DX: Disorder of thyroid, unspecified: E07.9

## 2014-07-11 HISTORY — DX: Type 2 diabetes mellitus without complications: E11.9

## 2014-07-11 LAB — URINALYSIS, ROUTINE W REFLEX MICROSCOPIC
Bilirubin Urine: NEGATIVE
Glucose, UA: >1000 mg/dL — AB
LEUKOCYTES UA: NEGATIVE
NITRITE: NEGATIVE
PH: 5.5 (ref 5.0–8.0)
Protein, ur: NEGATIVE mg/dL
SPECIFIC GRAVITY, URINE: 1.027 (ref 1.005–1.030)
UROBILINOGEN UA: 0.2 mg/dL (ref 0.0–1.0)

## 2014-07-11 LAB — TROPONIN I: Troponin I: <0.3 ng/mL (ref <0.30)

## 2014-07-11 LAB — COMPREHENSIVE METABOLIC PANEL
ALBUMIN: 4.1 g/dL (ref 3.5–5.2)
ALT: 13 U/L (ref 0–35)
ANION GAP: 29 — AB (ref 5–15)
AST: 20 U/L (ref 0–37)
Alkaline Phosphatase: 171 U/L — ABNORMAL HIGH (ref 39–117)
BUN: 18 mg/dL (ref 6–23)
CALCIUM: 9.6 mg/dL (ref 8.4–10.5)
CO2: 11 mEq/L — ABNORMAL LOW (ref 19–32)
CREATININE: 0.6 mg/dL (ref 0.50–1.10)
Chloride: 92 mEq/L — ABNORMAL LOW (ref 96–112)
GFR calc Af Amer: >90 mL/min (ref >90)
Glucose, Bld: 414 mg/dL — ABNORMAL HIGH (ref 70–99)
Potassium: 5.1 mEq/L (ref 3.7–5.3)
Sodium: 132 mEq/L — ABNORMAL LOW (ref 137–147)
Total Bilirubin: 1 mg/dL (ref 0.3–1.2)
Total Protein: 8.4 g/dL — ABNORMAL HIGH (ref 6.0–8.3)

## 2014-07-11 LAB — CBG MONITORING, ED
GLUCOSE-CAPILLARY: 408 mg/dL — AB (ref 70–99)
GLUCOSE-CAPILLARY: 306 mg/dL — AB (ref 70–99)
Glucose-Capillary: 399 mg/dL — ABNORMAL HIGH (ref 70–99)
Glucose-Capillary: 243 mg/dL — ABNORMAL HIGH (ref 70–99)

## 2014-07-11 LAB — I-STAT VENOUS BLOOD GAS, ED
ACID-BASE DEFICIT: 14 mmol/L — AB (ref 0.0–2.0)
BICARBONATE: 12.3 meq/L — AB (ref 20.0–24.0)
O2 Saturation: 63 %
PH VEN: 7.209 — AB (ref 7.250–7.300)
TCO2: 13 mmol/L (ref 0–100)
pCO2, Ven: 30.7 mmHg — ABNORMAL LOW (ref 45.0–50.0)
pO2, Ven: 39 mmHg (ref 30.0–45.0)

## 2014-07-11 LAB — CBC WITH DIFFERENTIAL/PLATELET
BASOS ABS: 0 10*3/uL (ref 0.0–0.1)
BASOS PCT: 0 % (ref 0–1)
EOS PCT: 0 % (ref 0–5)
Eosinophils Absolute: 0 10*3/uL (ref 0.0–0.7)
HEMATOCRIT: 41.8 % (ref 36.0–46.0)
Hemoglobin: 14.7 g/dL (ref 12.0–15.0)
Lymphocytes Relative: 5 % — ABNORMAL LOW (ref 12–46)
Lymphs Abs: 1 10*3/uL (ref 0.7–4.0)
MCH: 30.9 pg (ref 26.0–34.0)
MCHC: 35.2 g/dL (ref 30.0–36.0)
MCV: 88 fL (ref 78.0–100.0)
MONO ABS: 0.6 10*3/uL (ref 0.1–1.0)
Monocytes Relative: 3 % (ref 3–12)
Neutro Abs: 18.7 10*3/uL — ABNORMAL HIGH (ref 1.7–7.7)
Neutrophils Relative %: 92 % — ABNORMAL HIGH (ref 43–77)
Platelets: 308 10*3/uL (ref 150–400)
RBC: 4.75 MIL/uL (ref 3.87–5.11)
RDW: 11.9 % (ref 11.5–15.5)
WBC: 20.3 10*3/uL — ABNORMAL HIGH (ref 4.0–10.5)

## 2014-07-11 LAB — URINE MICROSCOPIC-ADD ON

## 2014-07-11 LAB — I-STAT CG4 LACTIC ACID, ED: LACTIC ACID, VENOUS: 2.89 mmol/L — AB (ref 0.5–2.2)

## 2014-07-11 LAB — PREGNANCY, URINE: Preg Test, Ur: NEGATIVE

## 2014-07-11 LAB — LIPASE, BLOOD: LIPASE: 12 U/L (ref 11–59)

## 2014-07-11 MED ORDER — SODIUM CHLORIDE 0.9 % IV BOLUS (SEPSIS)
2000.0000 mL | Freq: Once | INTRAVENOUS | Status: AC
  Administered 2014-07-11: 2000 mL via INTRAVENOUS

## 2014-07-11 MED ORDER — ONDANSETRON HCL 4 MG/2ML IJ SOLN: 4.0000 mg | Freq: Three times a day (TID) | INTRAMUSCULAR | Status: DC | PRN

## 2014-07-11 MED ORDER — SODIUM CHLORIDE 0.9 % IV SOLN: INTRAVENOUS | Status: AC

## 2014-07-11 MED ORDER — ONDANSETRON HCL 4 MG/2ML IJ SOLN
4.0000 mg | Freq: Once | INTRAMUSCULAR | Status: AC
  Administered 2014-07-11: 4 mg via INTRAVENOUS
  Filled 2014-07-11: qty 2

## 2014-07-11 MED ORDER — PROMETHAZINE HCL 25 MG/ML IJ SOLN
25.0000 mg | Freq: Once | INTRAMUSCULAR | Status: AC
  Administered 2014-07-11: 25 mg via INTRAVENOUS
  Filled 2014-07-11: qty 1

## 2014-07-11 MED ORDER — INSULIN REGULAR HUMAN 100 UNIT/ML IJ SOLN
INTRAMUSCULAR | Status: DC
  Administered 2014-07-11: 3.5 [IU]/h via INTRAVENOUS
  Administered 2014-07-12: 3.6 [IU]/h via INTRAVENOUS

## 2014-07-11 MED ORDER — HYDROMORPHONE HCL 1 MG/ML IJ SOLN: 1.0000 mg | INTRAMUSCULAR | Status: DC | PRN

## 2014-07-11 MED ORDER — MORPHINE SULFATE 4 MG/ML IJ SOLN
4.0000 mg | Freq: Once | INTRAMUSCULAR | Status: AC
  Administered 2014-07-11: 4 mg via INTRAVENOUS
  Filled 2014-07-11: qty 1

## 2014-07-11 MED ORDER — SODIUM CHLORIDE 0.9 % IV BOLUS (SEPSIS)
1000.0000 mL | Freq: Once | INTRAVENOUS | Status: DC
  Administered 2014-07-11: 1000 mL via INTRAVENOUS

## 2014-07-11 MED ORDER — DEXTROSE-NACL 5-0.45 % IV SOLN
INTRAVENOUS | Status: DC
  Administered 2014-07-11: 1000 mL via INTRAVENOUS
  Administered 2014-07-12: 08:00:00 via INTRAVENOUS

## 2014-07-11 NOTE — ED Notes (Signed)
Pt is wearing INSULIN PUMP

## 2014-07-11 NOTE — ED Provider Notes (Signed)
CSN: [id number]     Arrival date & time 07/11/14  1821 History  This chart was scribed for Richardean Canalavid H Omarrion Carmer, MD by Tonye RoyaltyJoshua Chen, ED Scribe. This patient was seen in room MH04/MH04 and the patient's care was started at 6:30 PM.   No chief complaint on file.   The history is provided by the patient. No language interpreter was used.     HPI Comments: [name] is a 38 y.o. female with history of type 1 diabetes who presents to the Emergency Department complaining of chest pain that has been off and on all day.  The pain goes down her arm at times.  The pain lasts a few seconds.  She has never had pain like this before and is having trouble breathing.  She started throwing up at 11:30 am and has been throwing up since then.  She is not able to keep anythign down and her emesis was a clear color.  Sugar was 380 and she is a type 1 diabetic.  She has not had any adjustment to the insulin pump.  She has been having fevers due to sinus infection for which she was prescribed doxycylin.  She has had major pain in her teeth and jaw but no runny nose.  Patient has has DKA 1 time before.  Patient has had two c sections. No past MI's. Patient is allergic to penicillin.  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    Past Medical History  Diagnosis Date  . Diabetes mellitus without complication   . Thyroid disease    Past Surgical History  Procedure Laterality Date  . Eye surgery    . Cesarean section     No family history on file. History  Substance Use Topics  . Smoking status: Never Smoker   . Smokeless tobacco: Not on file  . Alcohol Use: No   OB History    No data available     Review of Systems  Constitutional: Positive for fever.  HENT: Positive for dental problem. Negative for facial swelling and rhinorrhea.   Respiratory: Positive for shortness of breath.   Cardiovascular: Positive  for chest pain.  Gastrointestinal: Positive for vomiting.  All other systems reviewed and are negative.     Allergies  Penicillins  Home Medications   Prior to Admission medications   Not on File   BP 114/58 mmHg  Pulse 108  Temp(Src) 98.4 F (36.9 C) (Oral)  Resp 21  Ht 5\' 4"  (1.626 m)  Wt 135 lb (61.236 kg)  BMI 23.16 kg/m2  SpO2 99%  LMP 06/27/2014 Physical Exam  Constitutional: She is oriented to person, place, and time. She appears well-developed and well-nourished.  HENT:  Head: Normocephalic and atraumatic.  Right sinus tenderness Dry mucous membranes\ Oropharynx clear  Eyes: Conjunctivae are normal.  Neck: Normal range of motion. Neck supple.  Cardiovascular: Normal rate, regular rhythm and normal heart sounds.   No murmur heard. Pulmonary/Chest: Effort normal and breath sounds normal. No respiratory distress. She has no wheezes. She has no rales.  Abdominal: Soft. There is no tenderness.  Musculoskeletal: Normal range of motion.  Neurological: She is alert and oriented to person, place, and time.  Skin: Skin is warm and dry.  Psychiatric: She has a normal mood and affect.  Nursing note and vitals reviewed.   ED Course  Procedures (including critical care time)  DIAGNOSTIC STUDIES: Oxygen Saturation is 100% on room air, normal by my interpretation.    COORDINATION OF CARE: 6:36 PM Discussed treatment plan with patient at beside, the patient agrees with the plan and has no further questions at this time.   CRITICAL CARE Performed by: Silverio LayYAO, Donnajean Chesnut   Total critical care time: 30 min   Critical care time was exclusive of separately billable procedures and treating other patients.  Critical care was necessary to treat or prevent imminent or life-threatening deterioration.  Critical care was time spent personally by me on the following activities: development of treatment plan with patient and/or surrogate as well as nursing, discussions with  consultants, evaluation of patient's response to treatment, examination of patient, obtaining history from patient or surrogate, ordering and performing treatments and interventions, ordering and review of laboratory studies, ordering and review of radiographic studies, pulse oximetry and re-evaluation of patient's condition.   Labs Review Labs Reviewed  CBC WITH DIFFERENTIAL - Abnormal; Notable for the following:    WBC 20.3 (*)    Neutrophils Relative % 92 (*)    Neutro Abs 18.7 (*)    Lymphocytes Relative 5 (*)    All other components within normal limits  COMPREHENSIVE METABOLIC PANEL - Abnormal; Notable for the following:    Sodium 132 (*)    Chloride 92 (*)    CO2 11 (*)    Glucose, Bld 414 (*)    Total Protein 8.4 (*)    Alkaline Phosphatase 171 (*)    Anion gap 29 (*)    All other components within normal limits  CBG MONITORING, ED - Abnormal; Notable for the following:    Glucose-Capillary 399 (*)    All other components within normal limits  I-STAT VENOUS BLOOD GAS, ED - Abnormal; Notable for the following:    pH, Ven 7.209 (*)    pCO2, Ven 30.7 (*)    Bicarbonate 12.3 (*)    Acid-base deficit 14.0 (*)    All other components within normal limits  I-STAT CG4 LACTIC ACID, ED - Abnormal; Notable for the following:    Lactic Acid, Venous 2.89 (*)    All other components within normal limits  CULTURE, BLOOD (ROUTINE X 2)  CULTURE, BLOOD (ROUTINE X 2)  URINE CULTURE  LIPASE, BLOOD  TROPONIN I  URINALYSIS, ROUTINE W REFLEX MICROSCOPIC  PREGNANCY, URINE    Imaging Review No results found.   EKG Interpretation   Date/Time:  Thursday July 11 2014 18:28:03 EST Ventricular Rate:  104 PR Interval:  146 QRS Duration: 78 QT Interval:  350 QTC Calculation: 460 R Axis:   86 Text Interpretation:  Sinus tachycardia Otherwise normal ECG No  significant change since last tracing Confirmed by Telina Kleckley  MD, Nneka Blanda ([id number])  on 07/11/2014 6:29:16 PM      MDM   Final  diagnoses:  Vomiting  Chest pain    [name] is a 38 y.o. female here with vomiting, abdominal pain. CBG was 400 on arrival. Appears dehydrated. Concerned for possible DKA. Will do sepsis workup, check labs and VBG.   7:46 PM She is acidotic to 7.2. Bicarb 11. WBC 20. I added on lactate, cultures. Started on glucostabilizer and turned off her insulin pump. Will admit to stepdown under Dr. Timothy Lassousso.    I personally performed the services described in this documentation, which was scribed in my presence. The recorded information has been reviewed and is accurate.    Richardean Canalavid H Daved Mcfann, MD 07/11/14 [phone]

## 2014-07-11 NOTE — H&P (Signed)
[name] is an [age] y.o. female.   PCP:   Gwen PoundsUSSO,Malkie Wille M, MD   Chief Complaint:  DKA, elevated sugars.  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 She is not able to keep anythign down and her emesis was a clear color. Sugar was up to 380. She has not had any issues c her insulin pump. She has been having fevers due to sinus infection for which she was prescribed doxycyclin (per [facility]). She has had major pain in her teeth and jaw but no runny nose. W/up in ED revealed DKA.  Given Glucose stabilizer, fluids, IV Insulin and Pump removed.  I was called for inpt admit.  We decided to admit to stepdown.  Since [facility] > [facility]  Once there I talked c Nechelle.  She was already on D5 and her Insulin gtt.  Sugars in 200's and N/V was much better.  She felt better.  She reports sugars were good tues/Wed and Thursday they spiked out of control.  She states no issue c Insertion sets or any other issue with delivery system.  I placed admission orders at 12:45 am 12/18.    Pt seen @ 6:30 am and she is much better No more n/v No Ab pain. No Chest pain She is much better Gap closed from 29 to 16 Current labs P    Past Medical History:  Past Medical History  Diagnosis Date  . Diabetes mellitus without complication   . Thyroid disease     Past Surgical History  Procedure Laterality Date  . Eye surgery    . Cesarean section        Allergies:   Allergies  Allergen Reactions  . Penicillins     Hives rash     Medications: Prior to Admission medications   Not on File     Medications Prior to Admission  Medication Sig Dispense Refill  . insulin regular  (NOVOLIN R,HUMULIN R) 100 units/mL injection Inject into the skin 3 (three) times daily before meals.       Social History:  reports that she has never smoked. She does not have any smokeless tobacco history on file. She reports that she does not drink alcohol or use illicit drugs.  Family History: No family history on file.  Review of Systems:  Review of Systems -  Full ROS Obtained. (-) Darrhea/Constipation No Urine Sxs.  Physical Exam:  Blood pressure 105/50, pulse 97, temperature 98 F (36.7 C), temperature source Oral, resp. rate 16, height 5\' 4"  (1.626 m), weight 65.2 kg (143 lb 11.8 oz), last menstrual period 06/27/2014, SpO2 98 %. Filed Vitals:   07/11/14 2025 07/11/14 2330 07/12/14 0343 07/12/14 0346  BP: 106/41 106/43  105/50  Pulse: 116 115  97  Temp:  98.8 F (37.1 C) 98 F (36.7 C)   TempSrc:  Oral Oral   Resp: 20 21  16   Height:  5\' 4"  (1.626 m)    Weight:  65.2 kg (143 lb 11.8 oz)    SpO2: 100% 99%  98%   General appearance: A and O Head: Normocephalic, without obvious abnormality, atraumatic Eyes: conjunctivae/corneas clear. PERRL, EOM's intact.  Nose: Min sinus tenderness. Throat: Very Dry Mouth Neck: no adenopathy, no carotid bruit, no JVD and thyroid not enlarged, symmetric, no tenderness/mass/nodules Resp: CTA Cardio: Reg GI: soft, non-tender; bowel sounds normal; no masses,  no organomegaly Insertion set C/D/I Extremities: extremities normal, atraumatic, no cyanosis or edema Pulses: 2+ and symmetric Lymph nodes: no cervical lymphadenopathy Neurologic: Alert and oriented X 3, normal strength and tone. Normal symmetric reflexes.     Labs on Admission:   Recent Labs  07/11/14 1850 07/12/14 0113  NA 132* 136*  K 5.1 4.7  CL 92* 107  CO2 11* 13*  GLUCOSE 414* 197*  BUN 18 13  CREATININE 0.60 0.58  CALCIUM 9.6 8.3*    Recent Labs  07/11/14 1850  AST 20  ALT 13  ALKPHOS 171*  BILITOT 1.0  PROT 8.4*  ALBUMIN 4.1    Recent Labs   07/11/14 1850  LIPASE 12    Recent Labs  07/11/14 1850  WBC 20.3*  NEUTROABS 18.7*  HGB 14.7  HCT 41.8  MCV 88.0  PLT 308    Recent Labs  07/11/14 1850  TROPONINI <0.30   Lab Results  Component Value Date   INR 1.01 07/05/2010     LAB RESULT POCT:  Results for orders placed or performed during the hospital encounter of 07/11/14  MRSA PCR Screening  Result Value Ref Range   MRSA by PCR NEGATIVE NEGATIVE  CBC with Differential  Result Value Ref Range   WBC 20.3 (H) 4.0 - 10.5 K/uL   RBC 4.75 3.87 - 5.11 MIL/uL   Hemoglobin 14.7 12.0 - 15.0 g/dL   HCT 09.8 11.9 - 14.7 %   MCV 88.0 78.0 - 100.0 fL   MCH 30.9 26.0 - 34.0 pg   MCHC 35.2 30.0 - 36.0 g/dL   RDW 82.9 56.2 - 13.0 %   Platelets 308 150 - 400 K/uL   Neutrophils Relative % 92 (H) 43 - 77 %   Neutro Abs 18.7 (H) 1.7 - 7.7 K/uL   Lymphocytes Relative 5 (L) 12 - 46 %   Lymphs Abs 1.0 0.7 - 4.0 K/uL   Monocytes Relative 3 3 - 12 %   Monocytes Absolute 0.6 0.1 - 1.0 K/uL   Eosinophils Relative 0 0 - 5 %   Eosinophils Absolute 0.0 0.0 - 0.7 K/uL   Basophils Relative 0 0 - 1 %   Basophils Absolute 0.0 0.0 - 0.1 K/uL  Comprehensive metabolic panel  Result Value Ref Range   Sodium 132 (L) 137 - 147 mEq/L   Potassium 5.1 3.7 - 5.3 mEq/L   Chloride 92 (L) 96 - 112 mEq/L   CO2 11 (L) 19 - 32 mEq/L   Glucose, Bld 414 (H) 70 - 99 mg/dL   BUN 18 6 - 23 mg/dL   Creatinine, Ser 8.65 0.50 - 1.10 mg/dL   Calcium 9.6 8.4 - 78.4 mg/dL   Total Protein 8.4 (H) 6.0 - 8.3 g/dL   Albumin 4.1 3.5 - 5.2 g/dL   AST 20 0 - 37 U/L   ALT 13 0 - 35 U/L   Alkaline Phosphatase 171 (H) 39 - 117 U/L   Total Bilirubin 1.0 0.3 - 1.2 mg/dL   GFR calc non Af Amer >90 >90 mL/min   GFR calc Af Amer >90 >90 mL/min   Anion gap 29 (H) 5 - 15  Lipase, blood  Result Value Ref Range   Lipase 12 11 - 59 U/L  Urinalysis, Routine w reflex  microscopic  Result Value Ref Range   Color, Urine YELLOW YELLOW   APPearance CLEAR CLEAR    Specific Gravity, Urine 1.027 1.005 - 1.030   pH 5.5 5.0 - 8.0   Glucose, UA >1000 (A) NEGATIVE mg/dL   Hgb urine dipstick SMALL (A) NEGATIVE   Bilirubin Urine NEGATIVE NEGATIVE   Ketones, ur >80 (A) NEGATIVE mg/dL   Protein, ur NEGATIVE NEGATIVE mg/dL   Urobilinogen, UA 0.2 0.0 - 1.0 mg/dL   Nitrite NEGATIVE NEGATIVE   Leukocytes, UA NEGATIVE NEGATIVE  Pregnancy, urine  Result Value Ref Range   Preg Test, Ur NEGATIVE NEGATIVE  Troponin I  Result Value Ref Range   Troponin I <0.30 <0.30 ng/mL  Urine microscopic-add on  Result Value Ref Range   Squamous Epithelial / LPF RARE RARE   RBC / HPF 3-6 <3 RBC/hpf   Bacteria, UA RARE RARE  Glucose, capillary  Result Value Ref Range   Glucose-Capillary 218 (H) 70 - 99 mg/dL  Glucose, capillary  Result Value Ref Range   Glucose-Capillary 182 (H) 70 - 99 mg/dL  Basic metabolic panel  Result Value Ref Range   Sodium 136 (L) 137 - 147 mEq/L   Potassium 4.7 3.7 - 5.3 mEq/L   Chloride 107 96 - 112 mEq/L   CO2 13 (L) 19 - 32 mEq/L   Glucose, Bld 197 (H) 70 - 99 mg/dL   BUN 13 6 - 23 mg/dL   Creatinine, Ser 1.61 0.50 - 1.10 mg/dL   Calcium 8.3 (L) 8.4 - 10.5 mg/dL   GFR calc non Af Amer >90 >90 mL/min   GFR calc Af Amer >90 >90 mL/min   Anion gap 16 (H) 5 - 15  Glucose, capillary  Result Value Ref Range   Glucose-Capillary 169 (H) 70 - 99 mg/dL  Glucose, capillary  Result Value Ref Range   Glucose-Capillary 159 (H) 70 - 99 mg/dL  Glucose, capillary  Result Value Ref Range   Glucose-Capillary 138 (H) 70 - 99 mg/dL  Glucose, capillary  Result Value Ref Range   Glucose-Capillary 122 (H) 70 - 99 mg/dL  Glucose, capillary  Result Value Ref Range   Glucose-Capillary 128 (H) 70 - 99 mg/dL  POC CBG, ED  Result Value Ref Range   Glucose-Capillary 399 (H) 70 - 99 mg/dL   Comment 1 Notify RN    Comment 2 Documented in Chart   I-Stat Venous Blood Gas, ED (order at [facility] and [facility] only)  Result Value Ref Range   pH, Ven 7.209 (L) 7.250 -  7.300   pCO2, Ven 30.7 (L) 45.0 - 50.0 mmHg   pO2, Ven 39.0 30.0 - 45.0 mmHg   Bicarbonate 12.3 (L) 20.0 - 24.0 mEq/L   TCO2 13 0 - 100 mmol/L   O2 Saturation 63.0 %   Acid-base deficit 14.0 (H) 0.0 - 2.0 mmol/L   Collection site IV START    Drawn by Nurse    Sample type VENOUS    Comment NOTIFIED PHYSICIAN   I-Stat CG4 Lactic Acid, ED  Result Value Ref Range   Lactic Acid, Venous 2.89 (H) 0.5 - 2.2 mmol/L  CBG monitoring, ED  Result Value Ref Range   Glucose-Capillary 408 (H) 70 - 99 mg/dL  CBG monitoring, ED  Result Value Ref Range   Glucose-Capillary 306 (H) 70 - 99 mg/dL  CBG monitoring, ED  Result Value Ref Range   Glucose-Capillary 243 (H) 70 - 99 mg/dL   Comment 1 Glucose Stabilizer  Radiological Exams on Admission: Dg Abd Acute W/chest  07/11/2014   CLINICAL DATA:  Vomiting and chest pain  EXAM: ACUTE ABDOMEN SERIES (ABDOMEN 2 VIEW & CHEST 1 VIEW)  COMPARISON:  Chest CT 07/05/2010  FINDINGS: There is no evidence of dilated bowel loops or free intraperitoneal air. No radiopaque calculi or other significant radiographic abnormality is seen. Heart size and mediastinal contours are within normal limits. Both lungs are clear. Mild dextro curvature of the lumbar spine; No acute osseous findings.  IMPRESSION: Negative abdominal radiographs.  No acute cardiopulmonary disease.   Electronically Signed   By: Tiburcio PeaJonathan  Watts M.D.   On: 07/11/2014 21:04      Orders placed or performed during the hospital encounter of 07/11/14  . ED EKG  . ED EKG  . EKG 12-Lead  . EKG 12-Lead     Assessment/Plan Active Problems:   DKA (diabetic ketoacidoses)   DKA- pH 7.209.  No More N/V.  Already doing much better c IVF Hydration and IV Insulin via Glucose Stabilizer.  Gap closed from 29 to 16 and expect more on current lab draw. I asked her to change insertion set prior to hooking pump back up today. Will place pump back on when gap closed and monitor sugars and keep pt until one  more BMET shows stability She should wear Sensor more often.  Last worn > 450 days ago Examination of pump reveals no issues to me.  Sinusitis - Doxy for 7 days.  Mild coagulopathy - ASA.  Lovenox/Squeezers for DVT proph.  Chest pains - Ti (-) .  Will check one more.  EKG was (-)   Leukocytosis - from the DKA.   Annemarie Sebree M 07/12/2014, 6:51 AM

## 2014-07-11 NOTE — ED Notes (Signed)
Fever and sinus symptoms for a week. Hx diabetes. She has an insulin pump. Vomited 7x's today. Chest pain.

## 2014-07-12 LAB — BASIC METABOLIC PANEL
ANION GAP: 16 — AB (ref 5–15)
Anion gap: 16 — ABNORMAL HIGH (ref 5–15)
Anion gap: 14 (ref 5–15)
Anion gap: 16 — ABNORMAL HIGH (ref 5–15)
Anion gap: 11 (ref 5–15)
BUN: 13 mg/dL (ref 6–23)
BUN: 11 mg/dL (ref 6–23)
BUN: 10 mg/dL (ref 6–23)
BUN: 9 mg/dL (ref 6–23)
BUN: 9 mg/dL (ref 6–23)
CALCIUM: 8.4 mg/dL (ref 8.4–10.5)
CALCIUM: 8.5 mg/dL (ref 8.4–10.5)
CALCIUM: 8.4 mg/dL (ref 8.4–10.5)
CO2: 13 mEq/L — ABNORMAL LOW (ref 19–32)
CO2: 15 mEq/L — ABNORMAL LOW (ref 19–32)
CO2: 15 meq/L — AB (ref 19–32)
CO2: 16 meq/L — AB (ref 19–32)
CO2: 19 mEq/L (ref 19–32)
CREATININE: 0.5 mg/dL (ref 0.50–1.10)
CREATININE: 0.51 mg/dL (ref 0.50–1.10)
CREATININE: 0.53 mg/dL (ref 0.50–1.10)
Calcium: 8.3 mg/dL — ABNORMAL LOW (ref 8.4–10.5)
Calcium: 8.4 mg/dL (ref 8.4–10.5)
Chloride: 107 mEq/L (ref 96–112)
Chloride: 103 mEq/L (ref 96–112)
Chloride: 103 mEq/L (ref 96–112)
Chloride: 104 mEq/L (ref 96–112)
Chloride: 104 mEq/L (ref 96–112)
Creatinine, Ser: 0.58 mg/dL (ref 0.50–1.10)
Creatinine, Ser: 0.5 mg/dL (ref 0.50–1.10)
GFR calc Af Amer: >90 mL/min (ref >90)
GFR calc Af Amer: >90 mL/min (ref >90)
GFR calc Af Amer: >90 mL/min (ref >90)
GFR calc Af Amer: >90 mL/min (ref >90)
GFR calc non Af Amer: >90 mL/min (ref >90)
GFR calc non Af Amer: >90 mL/min (ref >90)
GLUCOSE: 267 mg/dL — AB (ref 70–99)
Glucose, Bld: 197 mg/dL — ABNORMAL HIGH (ref 70–99)
Glucose, Bld: 258 mg/dL — ABNORMAL HIGH (ref 70–99)
Glucose, Bld: 164 mg/dL — ABNORMAL HIGH (ref 70–99)
Glucose, Bld: 194 mg/dL — ABNORMAL HIGH (ref 70–99)
POTASSIUM: 4 meq/L (ref 3.7–5.3)
Potassium: 4.7 mEq/L (ref 3.7–5.3)
Potassium: 3.7 mEq/L (ref 3.7–5.3)
Potassium: 3.6 mEq/L — ABNORMAL LOW (ref 3.7–5.3)
Potassium: 3.4 mEq/L — ABNORMAL LOW (ref 3.7–5.3)
SODIUM: 134 meq/L — AB (ref 137–147)
SODIUM: 132 meq/L — AB (ref 137–147)
SODIUM: 136 meq/L — AB (ref 137–147)
Sodium: 136 mEq/L — ABNORMAL LOW (ref 137–147)
Sodium: 134 mEq/L — ABNORMAL LOW (ref 137–147)

## 2014-07-12 LAB — URINE CULTURE: Colony Count: 5000

## 2014-07-12 LAB — GLUCOSE, CAPILLARY
GLUCOSE-CAPILLARY: 169 mg/dL — AB (ref 70–99)
GLUCOSE-CAPILLARY: 128 mg/dL — AB (ref 70–99)
GLUCOSE-CAPILLARY: 175 mg/dL — AB (ref 70–99)
GLUCOSE-CAPILLARY: 260 mg/dL — AB (ref 70–99)
GLUCOSE-CAPILLARY: 161 mg/dL — AB (ref 70–99)
GLUCOSE-CAPILLARY: 130 mg/dL — AB (ref 70–99)
GLUCOSE-CAPILLARY: 209 mg/dL — AB (ref 70–99)
GLUCOSE-CAPILLARY: 206 mg/dL — AB (ref 70–99)
Glucose-Capillary: 119 mg/dL — ABNORMAL HIGH (ref 70–99)
Glucose-Capillary: 119 mg/dL — ABNORMAL HIGH (ref 70–99)
Glucose-Capillary: 122 mg/dL — ABNORMAL HIGH (ref 70–99)
Glucose-Capillary: 138 mg/dL — ABNORMAL HIGH (ref 70–99)
Glucose-Capillary: 159 mg/dL — ABNORMAL HIGH (ref 70–99)
Glucose-Capillary: 168 mg/dL — ABNORMAL HIGH (ref 70–99)
Glucose-Capillary: 182 mg/dL — ABNORMAL HIGH (ref 70–99)
Glucose-Capillary: 218 mg/dL — ABNORMAL HIGH (ref 70–99)
Glucose-Capillary: 357 mg/dL — ABNORMAL HIGH (ref 70–99)

## 2014-07-12 LAB — CBC
HEMATOCRIT: 36 % (ref 36.0–46.0)
Hemoglobin: 12.4 g/dL (ref 12.0–15.0)
MCH: 30 pg (ref 26.0–34.0)
MCHC: 34.4 g/dL (ref 30.0–36.0)
MCV: 87.2 fL (ref 78.0–100.0)
Platelets: 273 10*3/uL (ref 150–400)
RBC: 4.13 MIL/uL (ref 3.87–5.11)
RDW: 12.1 % (ref 11.5–15.5)
WBC: 16.1 10*3/uL — ABNORMAL HIGH (ref 4.0–10.5)

## 2014-07-12 LAB — TROPONIN I

## 2014-07-12 LAB — MRSA PCR SCREENING: MRSA by PCR: NEGATIVE

## 2014-07-12 MED ORDER — MORPHINE SULFATE 2 MG/ML IJ SOLN: 1.0000 mg | INTRAMUSCULAR | Status: DC | PRN

## 2014-07-12 MED ORDER — LEVOTHYROXINE SODIUM 112 MCG PO TABS
112.0000 ug | ORAL_TABLET | Freq: Every day | ORAL | Status: DC
  Administered 2014-07-12 – 2014-07-13 (×2): 112 ug via ORAL
  Filled 2014-07-12 (×3): qty 1

## 2014-07-12 MED ORDER — INFLUENZA VAC SPLIT QUAD 0.5 ML IM SUSY
0.5000 mL | PREFILLED_SYRINGE | INTRAMUSCULAR | Status: AC
  Administered 2014-07-13: 0.5 mL via INTRAMUSCULAR
  Filled 2014-07-12: qty 0.5

## 2014-07-12 MED ORDER — ONDANSETRON HCL 4 MG/2ML IJ SOLN: 4.0000 mg | Freq: Four times a day (QID) | INTRAMUSCULAR | Status: DC | PRN

## 2014-07-12 MED ORDER — SODIUM CHLORIDE 0.9 % IV BOLUS (SEPSIS)
500.0000 mL | Freq: Once | INTRAVENOUS | Status: AC
  Administered 2014-07-12: 500 mL via INTRAVENOUS

## 2014-07-12 MED ORDER — ACETAMINOPHEN 325 MG PO TABS: 650.0000 mg | ORAL_TABLET | Freq: Four times a day (QID) | ORAL | Status: DC | PRN

## 2014-07-12 MED ORDER — ASPIRIN 81 MG PO CHEW
81.0000 mg | CHEWABLE_TABLET | Freq: Every day | ORAL | Status: DC
  Administered 2014-07-12 – 2014-07-13 (×2): 81 mg via ORAL
  Filled 2014-07-12 (×2): qty 1

## 2014-07-12 MED ORDER — DOXYCYCLINE HYCLATE 100 MG PO TABS
100.0000 mg | ORAL_TABLET | Freq: Two times a day (BID) | ORAL | Status: DC
  Administered 2014-07-12 – 2014-07-13 (×4): 100 mg via ORAL
  Filled 2014-07-12 (×5): qty 1

## 2014-07-12 MED ORDER — ONDANSETRON HCL 4 MG PO TABS: 4.0000 mg | ORAL_TABLET | Freq: Four times a day (QID) | ORAL | Status: DC | PRN

## 2014-07-12 MED ORDER — ENOXAPARIN SODIUM 40 MG/0.4ML ~~LOC~~ SOLN
40.0000 mg | SUBCUTANEOUS | Status: DC
  Administered 2014-07-12: 40 mg via SUBCUTANEOUS
  Filled 2014-07-12 (×2): qty 0.4

## 2014-07-12 MED ORDER — ACETAMINOPHEN 650 MG RE SUPP: 650.0000 mg | Freq: Four times a day (QID) | RECTAL | Status: DC | PRN

## 2014-07-12 NOTE — Progress Notes (Signed)
I have been keeping [name] on her Anion Gap, Her Bicarb and her labs She is much better. No N/V Tolerating PO Intake and foods. She has slept and feels better than she did even 2-3 days prior to admission. Her gap is taking longer to close than expected. Awaiting 5 pm Lab draw Hoping to be able to stop gtt, restart pump - I asked her to also use sensor technology when she restarts it. After restart continue CBGs often and a BMET 4 hrs later to make sure gap stays closed.

## 2014-07-12 NOTE — Progress Notes (Signed)
Visited patient to speak with her about her insulin pump and diabetes.  Was diagnosed with Type 1 DM at age 38.  Sees Dr. Timothy Lassousso for diabetes management. Had been having a sinus infection last week.  Went to a [facility] this week and was given an antibiotic. Started feeling much worse yesterday and experiencing chest discomfort, elevated blood sugars.  [facility].  Was started on glucostabilizer at that time. Then transferred to this campus.  Explained to patient the procedure in using the IV insulin and then transitioning to insulin pump when her anion gap is closed and blood sugars are within range. Did get CHO coverage for breakfast.  Basal rates on pump: 12 am 0.95 units/hr.    4 am  1.25 units/hr.    9:30 am  1.30 units/hr.    3:00 pm 1.75 units/hr.    11:00 pm 1.0 units/hr. CHO coverage: 1:18 gms Sensitivity:40 Target range: 100-130 mg/dl  Will continue to follow while in hospital.  Smith MinceKendra Tarvis Blossom RN BSN CDE

## 2014-07-12 NOTE — Progress Notes (Signed)
Utilization review completed. Nelson Noone, RN, BSN. 

## 2014-07-13 LAB — BASIC METABOLIC PANEL
ANION GAP: 12 (ref 5–15)
Anion gap: 12 (ref 5–15)
Anion gap: 13 (ref 5–15)
BUN: 7 mg/dL (ref 6–23)
BUN: 6 mg/dL (ref 6–23)
BUN: 6 mg/dL (ref 6–23)
CALCIUM: 8.3 mg/dL — AB (ref 8.4–10.5)
CHLORIDE: 104 meq/L (ref 96–112)
CO2: 19 mEq/L (ref 19–32)
CO2: 20 mEq/L (ref 19–32)
CO2: 21 mEq/L (ref 19–32)
CREATININE: 0.51 mg/dL (ref 0.50–1.10)
CREATININE: 0.49 mg/dL — AB (ref 0.50–1.10)
Calcium: 8.2 mg/dL — ABNORMAL LOW (ref 8.4–10.5)
Calcium: 8.7 mg/dL (ref 8.4–10.5)
Chloride: 106 mEq/L (ref 96–112)
Chloride: 103 mEq/L (ref 96–112)
Creatinine, Ser: 0.52 mg/dL (ref 0.50–1.10)
GFR calc Af Amer: >90 mL/min (ref >90)
GFR calc Af Amer: >90 mL/min (ref >90)
GFR calc non Af Amer: >90 mL/min (ref >90)
GFR calc non Af Amer: >90 mL/min (ref >90)
GLUCOSE: 200 mg/dL — AB (ref 70–99)
GLUCOSE: 176 mg/dL — AB (ref 70–99)
Glucose, Bld: 103 mg/dL — ABNORMAL HIGH (ref 70–99)
POTASSIUM: 3.4 meq/L — AB (ref 3.7–5.3)
POTASSIUM: 3.3 meq/L — AB (ref 3.7–5.3)
Potassium: 3.4 mEq/L — ABNORMAL LOW (ref 3.7–5.3)
SODIUM: 138 meq/L (ref 137–147)
Sodium: 137 mEq/L (ref 137–147)
Sodium: 135 mEq/L — ABNORMAL LOW (ref 137–147)

## 2014-07-13 LAB — GLUCOSE, CAPILLARY
GLUCOSE-CAPILLARY: 149 mg/dL — AB (ref 70–99)
GLUCOSE-CAPILLARY: 76 mg/dL (ref 70–99)
GLUCOSE-CAPILLARY: 154 mg/dL — AB (ref 70–99)
GLUCOSE-CAPILLARY: 138 mg/dL — AB (ref 70–99)
Glucose-Capillary: 113 mg/dL — ABNORMAL HIGH (ref 70–99)
Glucose-Capillary: 118 mg/dL — ABNORMAL HIGH (ref 70–99)
Glucose-Capillary: 157 mg/dL — ABNORMAL HIGH (ref 70–99)
Glucose-Capillary: 169 mg/dL — ABNORMAL HIGH (ref 70–99)
Glucose-Capillary: 204 mg/dL — ABNORMAL HIGH (ref 70–99)
Glucose-Capillary: 98 mg/dL (ref 70–99)

## 2014-07-13 MED ORDER — SODIUM CHLORIDE 0.9 % IV SOLN: INTRAVENOUS | Status: DC

## 2014-07-13 MED ORDER — POTASSIUM CHLORIDE CRYS ER 20 MEQ PO TBCR
20.0000 meq | EXTENDED_RELEASE_TABLET | Freq: Once | ORAL | Status: AC
  Administered 2014-07-13: 20 meq via ORAL
  Filled 2014-07-13: qty 1

## 2014-07-13 MED ORDER — INSULIN PUMP
Freq: Three times a day (TID) | SUBCUTANEOUS | Status: DC
  Administered 2014-07-13: 1.4 via SUBCUTANEOUS
  Administered 2014-07-13: 0.9 via SUBCUTANEOUS
  Filled 2014-07-13: qty 1

## 2014-07-13 NOTE — Discharge Summary (Signed)
Physician Discharge Summary  Patient ID: [name] MRN: [id number] DOB/AGE: [birth year] 36 y.o.  Admit date: 07/11/2014 Discharge date: 07/13/2014  Admission Diagnoses:  Discharge Diagnoses:  Active Problems:   DKA (diabetic ketoacidoses)   Discharged Condition: improved, stable  Hospital Course: [name] is a pleasant 38 yo female admitted 2 days ago with sinusitis and DKA.  She was admitted to stepdown unit. She received IV insulin,  IV fluids and antibiotic for possible sinus infection.  It took some time but she did close her anion gap and by Saturday a.m. She was back to her baseline.   When she removed her old infusion catheter she found that it was totally kinked off so this probably contributed to her DKA.  She is discharged home in stable condition..  Consults: None  Significant Diagnostic Studies: none BMP Latest Ref Rng 07/13/2014 07/13/2014 07/12/2014  Glucose 70 - 99 mg/dL 409(W200(H) 119(J103(H) 478(G194(H)  BUN 6 - 23 mg/dL 6 7 9   Creatinine 0.50 - 1.10 mg/dL 9.56(O0.49(L) 1.300.51 8.650.53  Sodium 137 - 147 mEq/L 135(L) 137 134(L)  Potassium 3.7 - 5.3 mEq/L 3.4(L) 3.4(L) 3.4(L)  Chloride 96 - 112 mEq/L 103 106 104  CO2 19 - 32 mEq/L 20 19 19   Calcium 8.4 - 10.5 mg/dL 8.3(L) 8.2(L) 8.4    CBC Latest Ref Rng 07/12/2014 07/11/2014 07/05/2010  WBC 4.0 - 10.5 K/uL 16.1(H) 20.3(H) 10.3  Hemoglobin 12.0 - 15.0 g/dL 78.412.4 69.614.7 29.514.8  Hematocrit 36.0 - 46.0 % 36.0 41.8 41.4  Platelets 150 - 400 K/uL 273 308 288      Treatments: iv insulin, oral doxycycline, iv fluids  Discharge Exam:  Blood pressure 119/66, pulse 92, temperature 98.2 F (36.8 C), temperature source Oral, resp. rate 17, height 5\' 4"  (1.626 m), weight 65.2 kg (143 lb 11.8 oz), last menstrual period 06/27/2014, SpO2 100 %.  Physical Exam:age appropriate,  NAD,  Alert and oriented.  Lungs are CTA bilat. No w/r/r.  Heart is rrr no m/r/g. abd soft, nt, nd. No mass, no c/c/e.  Neuro grossly intact.  Disposition:to home.      Medication List    ASK your doctor about these medications        aspirin EC 81 MG tablet  Take 81 mg by mouth daily.     doxycycline 100 MG tablet  Commonly known as:  VIBRA-TABS  Take 100 mg by mouth 2 (two) times daily. 7 day regimen started 07/09/14     insulin aspart 100 UNIT/ML injection  Commonly known as:  novoLOG  Inject into the skin See admin instructions. Via insulin pump     Align otc probiotic one daily for two weeks.         SYNTHROID 112 MCG tablet  Generic drug:  levothyroxine  Take 112 mcg by mouth daily.     mvi daily and 1000 vit d3 daily suggested.   follow up with Dr. Timothy Lassousso. Call Monday for further instructions.  SignedRodrigo Ran: Offie Waide A 07/13/2014, 10:58 AM

## 2014-07-13 NOTE — Progress Notes (Signed)
Pt has had 4 CBGs within normal range and anion gap = 12. Dr. Waynard EdwardsPerini notified. New orders received to discontinue insulin gtt, IVF changed to NS KVO, and to begin insulin pump order set. Patient placed insulin pump in LLQ with no issues. Insulin Pump Therapy Contract signed and placed in shadow chart. Will continue to monitor.

## 2014-07-16 LAB — GLUCOSE, CAPILLARY
Glucose-Capillary: 274 mg/dL — ABNORMAL HIGH (ref 70–99)
Glucose-Capillary: 245 mg/dL — ABNORMAL HIGH (ref 70–99)

## 2014-07-17 LAB — CULTURE, BLOOD (ROUTINE X 2)
CULTURE: NO GROWTH
Culture: NO GROWTH

## 2015-12-06 IMAGING — CR DG ABDOMEN ACUTE W/ 1V CHEST
3 series · 3 of 3 positions shown · non-contrast
Comparison: Chest CT 07/05/2010

CLINICAL DATA: Vomiting and chest pain

EXAM:
ACUTE ABDOMEN SERIES (ABDOMEN 2 VIEW & CHEST 1 VIEW)

[w chest pa]
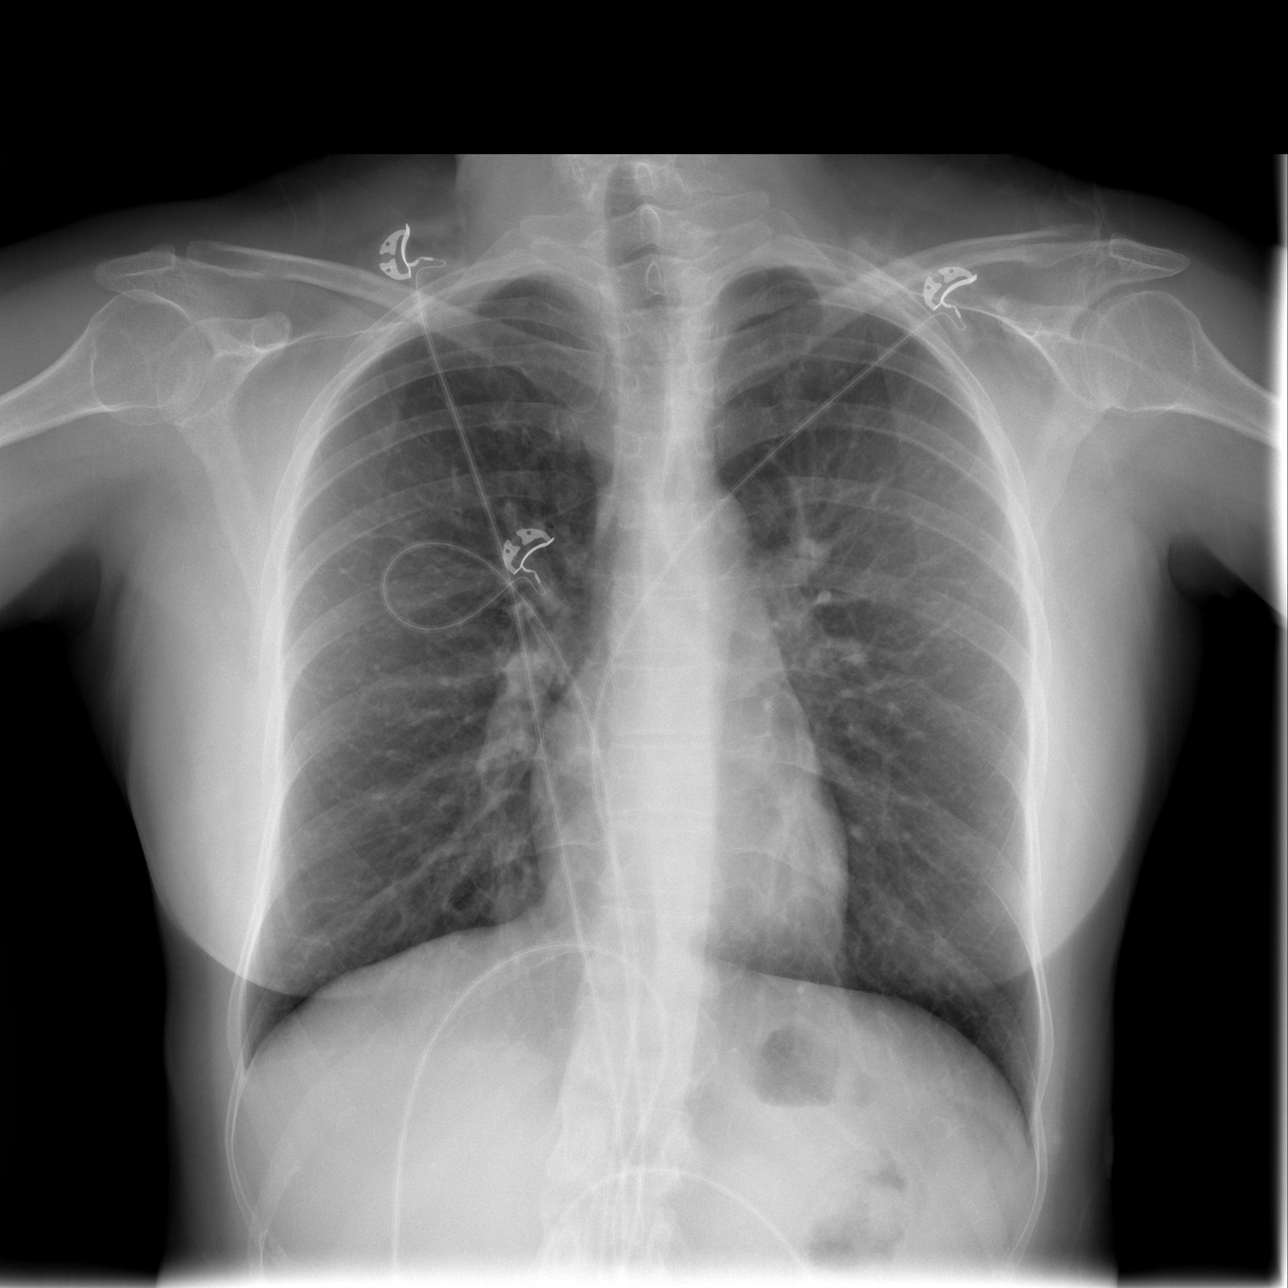

[w abdomen upright]
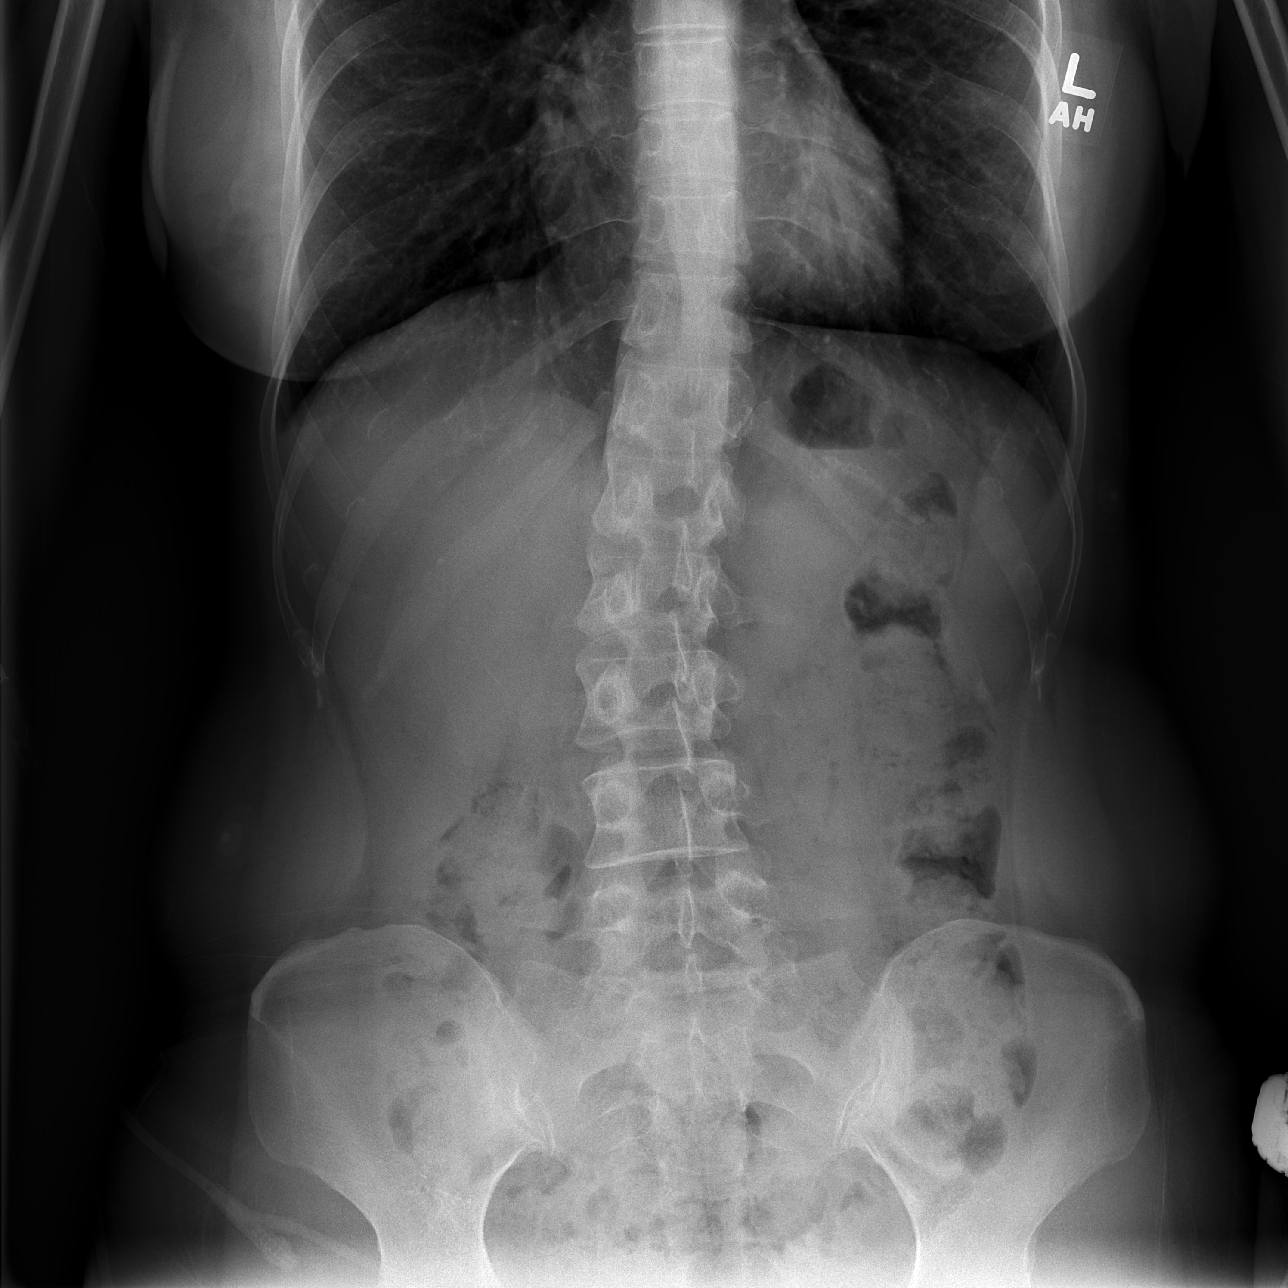

[t abdomen supine]
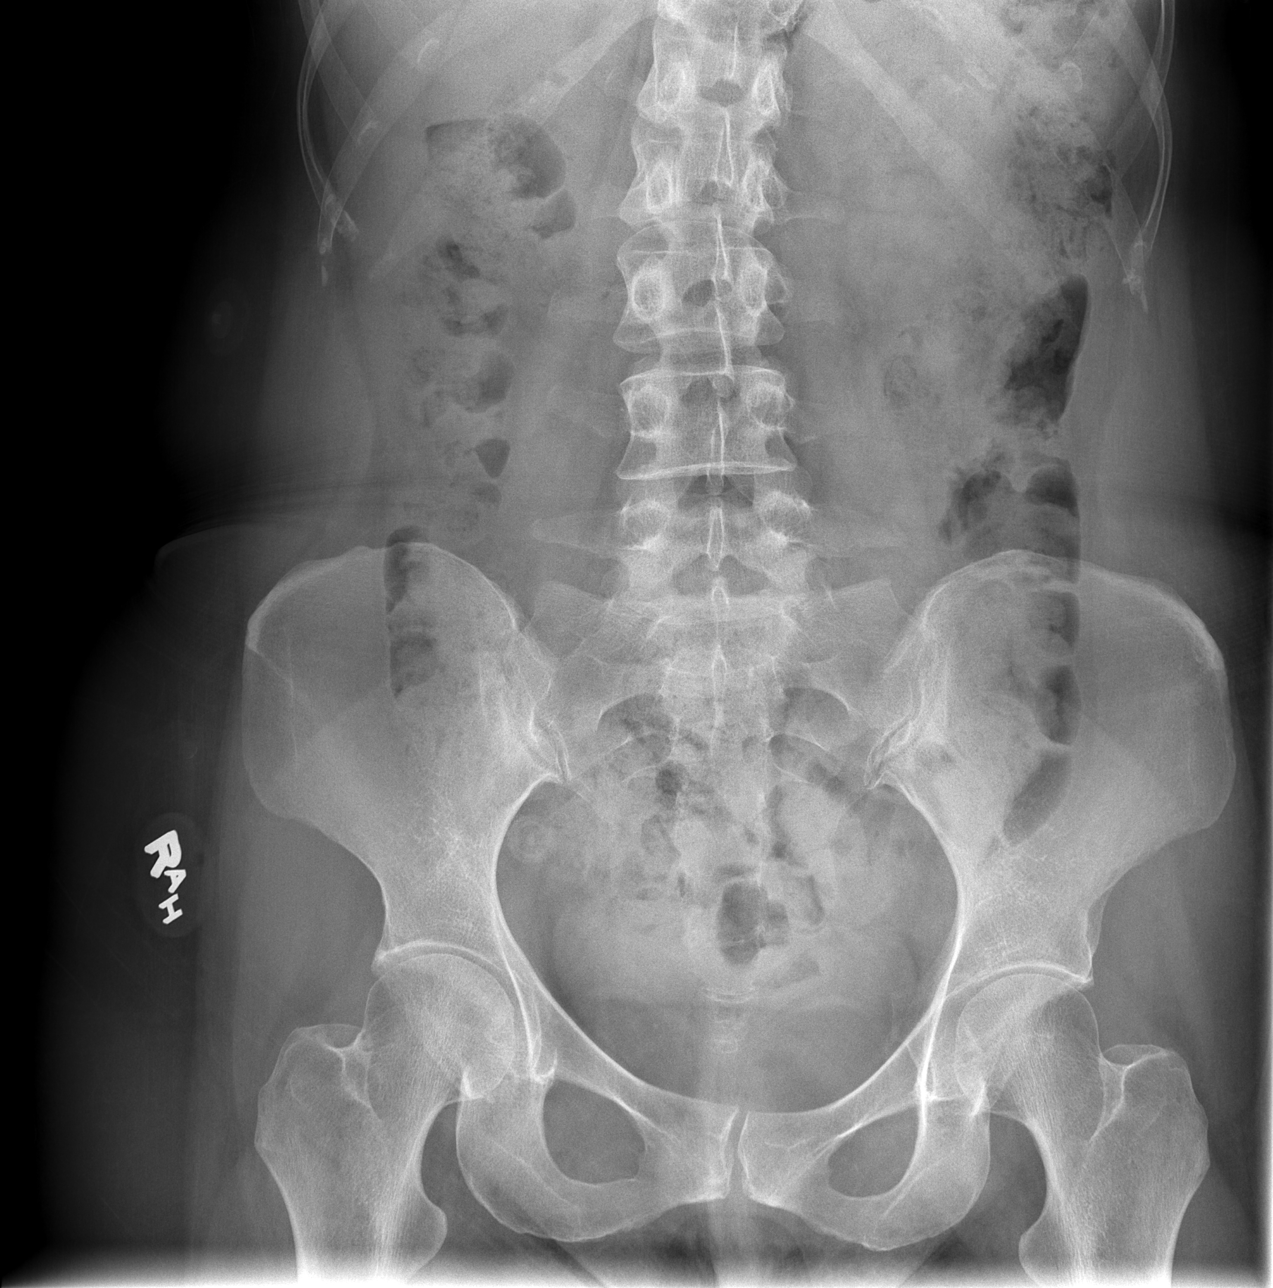

[3 of 3 positions shown; findings below may reference images not displayed]

FINDINGS: There is no evidence of dilated bowel loops or free intraperitoneal
air. No radiopaque calculi or other significant radiographic
abnormality is seen. Heart size and mediastinal contours are within
normal limits. Both lungs are clear. Mild dextro curvature of the
lumbar spine; No acute osseous findings.
IMPRESSION: Negative abdominal radiographs.  No acute cardiopulmonary disease.

## 2021-04-08 ENCOUNTER — Other Ambulatory Visit: Payer: Self-pay | Admitting: Internal Medicine

## 2021-04-08 DIAGNOSIS — Z1231 Encounter for screening mammogram for malignant neoplasm of breast: Secondary | ICD-10-CM
